# Patient Record
Sex: Male | Born: 1950 | Race: White | Hispanic: No | Marital: Married | Smoking: Former smoker
Health system: Southern US, Community
[De-identification: ages and names within clinical notes are randomized; demographics above are authoritative.]

## PROBLEM LIST (undated history)

## (undated) DIAGNOSIS — M1990 Unspecified osteoarthritis, unspecified site: Secondary | ICD-10-CM

## (undated) DIAGNOSIS — C801 Malignant (primary) neoplasm, unspecified: Secondary | ICD-10-CM

## (undated) DIAGNOSIS — K7031 Alcoholic cirrhosis of liver with ascites: Secondary | ICD-10-CM

## (undated) DIAGNOSIS — I10 Essential (primary) hypertension: Secondary | ICD-10-CM

## (undated) DIAGNOSIS — R29898 Other symptoms and signs involving the musculoskeletal system: Secondary | ICD-10-CM

## (undated) DIAGNOSIS — K759 Inflammatory liver disease, unspecified: Secondary | ICD-10-CM

## (undated) HISTORY — PX: NO PAST SURGERIES: SHX2092

---

## 2013-12-08 ENCOUNTER — Emergency Department: Payer: Self-pay | Admitting: Emergency Medicine

## 2016-11-27 ENCOUNTER — Encounter: Payer: Self-pay | Admitting: Emergency Medicine

## 2016-11-27 ENCOUNTER — Emergency Department: Payer: 59

## 2016-11-27 ENCOUNTER — Inpatient Hospital Stay
Admission: EM | Admit: 2016-11-27 | Discharge: 2016-11-30 | DRG: 434 | Disposition: A | Discharge status: Home / Self Care | Payer: 59 | Attending: Internal Medicine | Admitting: Internal Medicine

## 2016-11-27 ENCOUNTER — Inpatient Hospital Stay
Admit: 2016-11-27 | Discharge: 2016-11-27 | Disposition: A | Discharge status: Home / Self Care | Payer: 59 | Attending: Internal Medicine | Admitting: Internal Medicine

## 2016-11-27 DIAGNOSIS — R601 Generalized edema: Secondary | ICD-10-CM

## 2016-11-27 DIAGNOSIS — R946 Abnormal results of thyroid function studies: Secondary | ICD-10-CM | POA: Diagnosis present

## 2016-11-27 DIAGNOSIS — Z87891 Personal history of nicotine dependence: Secondary | ICD-10-CM

## 2016-11-27 DIAGNOSIS — R945 Abnormal results of liver function studies: Secondary | ICD-10-CM | POA: Diagnosis present

## 2016-11-27 DIAGNOSIS — R188 Other ascites: Secondary | ICD-10-CM

## 2016-11-27 DIAGNOSIS — I10 Essential (primary) hypertension: Secondary | ICD-10-CM | POA: Diagnosis present

## 2016-11-27 DIAGNOSIS — D509 Iron deficiency anemia, unspecified: Secondary | ICD-10-CM | POA: Diagnosis present

## 2016-11-27 DIAGNOSIS — F1021 Alcohol dependence, in remission: Secondary | ICD-10-CM | POA: Diagnosis present

## 2016-11-27 DIAGNOSIS — R609 Edema, unspecified: Secondary | ICD-10-CM | POA: Diagnosis present

## 2016-11-27 DIAGNOSIS — R5383 Other fatigue: Secondary | ICD-10-CM | POA: Diagnosis present

## 2016-11-27 DIAGNOSIS — Z79899 Other long term (current) drug therapy: Secondary | ICD-10-CM | POA: Diagnosis not present

## 2016-11-27 DIAGNOSIS — B1920 Unspecified viral hepatitis C without hepatic coma: Secondary | ICD-10-CM | POA: Diagnosis present

## 2016-11-27 DIAGNOSIS — R140 Abdominal distension (gaseous): Secondary | ICD-10-CM | POA: Diagnosis present

## 2016-11-27 DIAGNOSIS — D72819 Decreased white blood cell count, unspecified: Secondary | ICD-10-CM | POA: Diagnosis present

## 2016-11-27 DIAGNOSIS — E8770 Fluid overload, unspecified: Secondary | ICD-10-CM

## 2016-11-27 DIAGNOSIS — R770 Abnormality of albumin: Secondary | ICD-10-CM | POA: Diagnosis not present

## 2016-11-27 DIAGNOSIS — D539 Nutritional anemia, unspecified: Secondary | ICD-10-CM | POA: Diagnosis present

## 2016-11-27 DIAGNOSIS — R531 Weakness: Secondary | ICD-10-CM | POA: Diagnosis present

## 2016-11-27 DIAGNOSIS — R7989 Other specified abnormal findings of blood chemistry: Secondary | ICD-10-CM | POA: Diagnosis present

## 2016-11-27 DIAGNOSIS — R0602 Shortness of breath: Secondary | ICD-10-CM | POA: Diagnosis present

## 2016-11-27 DIAGNOSIS — E876 Hypokalemia: Secondary | ICD-10-CM | POA: Diagnosis present

## 2016-11-27 DIAGNOSIS — D649 Anemia, unspecified: Secondary | ICD-10-CM

## 2016-11-27 DIAGNOSIS — Z791 Long term (current) use of non-steroidal anti-inflammatories (NSAID): Secondary | ICD-10-CM

## 2016-11-27 DIAGNOSIS — K7031 Alcoholic cirrhosis of liver with ascites: Secondary | ICD-10-CM | POA: Diagnosis present

## 2016-11-27 DIAGNOSIS — E8809 Other disorders of plasma-protein metabolism, not elsewhere classified: Secondary | ICD-10-CM | POA: Diagnosis present

## 2016-11-27 HISTORY — DX: Essential (primary) hypertension: I10

## 2016-11-27 HISTORY — DX: Other symptoms and signs involving the musculoskeletal system: R29.898

## 2016-11-27 LAB — COMPREHENSIVE METABOLIC PANEL
ALK PHOS: 120 U/L (ref 38–126)
ALT: 42 U/L (ref 17–63)
AST: 100 U/L — AB (ref 15–41)
Albumin: 2.7 g/dL — ABNORMAL LOW (ref 3.5–5.0)
Anion gap: 6 (ref 5–15)
BUN: 19 mg/dL (ref 6–20)
CALCIUM: 8.2 mg/dL — AB (ref 8.9–10.3)
CHLORIDE: 100 mmol/L — AB (ref 101–111)
CO2: 31 mmol/L (ref 22–32)
CREATININE: 0.87 mg/dL (ref 0.61–1.24)
GFR calc non Af Amer: >60 mL/min (ref >60)
GLUCOSE: 111 mg/dL — AB (ref 65–99)
Potassium: 2.8 mmol/L — ABNORMAL LOW (ref 3.5–5.1)
SODIUM: 137 mmol/L (ref 135–145)
Total Bilirubin: 1.2 mg/dL (ref 0.3–1.2)
Total Protein: 6.9 g/dL (ref 6.5–8.1)

## 2016-11-27 LAB — APTT: aPTT: 33 seconds (ref 24–36)

## 2016-11-27 LAB — TROPONIN I: Troponin I: <0.03 ng/mL (ref <0.03)

## 2016-11-27 LAB — URINALYSIS, COMPLETE (UACMP) WITH MICROSCOPIC
BACTERIA UA: NONE SEEN
Bilirubin Urine: NEGATIVE
Glucose, UA: NEGATIVE mg/dL
Hgb urine dipstick: NEGATIVE
KETONES UR: NEGATIVE mg/dL
Leukocytes, UA: NEGATIVE
Nitrite: NEGATIVE
PH: 6 (ref 5.0–8.0)
PROTEIN: NEGATIVE mg/dL
Specific Gravity, Urine: 1.005 (ref 1.005–1.030)
Squamous Epithelial / LPF: NONE SEEN

## 2016-11-27 LAB — TYPE AND SCREEN
ABO/RH(D): A POS
Antibody Screen: NEGATIVE

## 2016-11-27 LAB — CBC WITH DIFFERENTIAL/PLATELET
BASOS ABS: 0 10*3/uL (ref 0–0.1)
BASOS PCT: 1 %
EOS ABS: 0.1 10*3/uL (ref 0–0.7)
EOS PCT: 3 %
HCT: 25.2 % — ABNORMAL LOW (ref 40.0–52.0)
HEMOGLOBIN: 7.9 g/dL — AB (ref 13.0–18.0)
Lymphocytes Relative: 23 %
Lymphs Abs: 0.9 10*3/uL — ABNORMAL LOW (ref 1.0–3.6)
MCH: 22 pg — AB (ref 26.0–34.0)
MCHC: 31.4 g/dL — ABNORMAL LOW (ref 32.0–36.0)
MCV: 70.2 fL — ABNORMAL LOW (ref 80.0–100.0)
Monocytes Absolute: 0.4 10*3/uL (ref 0.2–1.0)
Monocytes Relative: 11 %
NEUTROS PCT: 62 %
Neutro Abs: 2.4 10*3/uL (ref 1.4–6.5)
PLATELETS: 226 10*3/uL (ref 150–440)
RBC: 3.59 MIL/uL — AB (ref 4.40–5.90)
RDW: 18.9 % — ABNORMAL HIGH (ref 11.5–14.5)
WBC: 3.9 10*3/uL (ref 3.8–10.6)

## 2016-11-27 LAB — IRON AND TIBC
IRON: 12 ug/dL — AB (ref 45–182)
SATURATION RATIOS: 3 % — AB (ref 17.9–39.5)
TIBC: 393 ug/dL (ref 250–450)
UIBC: 381 ug/dL

## 2016-11-27 LAB — BRAIN NATRIURETIC PEPTIDE: B NATRIURETIC PEPTIDE 5: 104 pg/mL — AB (ref 0.0–100.0)

## 2016-11-27 LAB — PROTIME-INR
INR: 1.3
PROTHROMBIN TIME: 16.3 s — AB (ref 11.4–15.2)

## 2016-11-27 MED ORDER — TAMSULOSIN HCL 0.4 MG PO CAPS
0.4000 mg | ORAL_CAPSULE | Freq: Every day | ORAL | Status: DC
  Administered 2016-11-27 – 2016-11-30 (×4): 0.4 mg via ORAL
  Filled 2016-11-27 (×4): qty 1

## 2016-11-27 MED ORDER — OXYCODONE-ACETAMINOPHEN 10-325 MG PO TABS: 1.0000 | ORAL_TABLET | Freq: Four times a day (QID) | ORAL | Status: DC | PRN

## 2016-11-27 MED ORDER — HEPARIN SODIUM (PORCINE) 5000 UNIT/ML IJ SOLN
5000.0000 [IU] | Freq: Three times a day (TID) | INTRAMUSCULAR | Status: DC
  Administered 2016-11-27 – 2016-11-30 (×8): 5000 [IU] via SUBCUTANEOUS
  Filled 2016-11-27 (×8): qty 1

## 2016-11-27 MED ORDER — MELOXICAM 7.5 MG PO TABS
15.0000 mg | ORAL_TABLET | Freq: Every day | ORAL | Status: DC
  Administered 2016-11-27 – 2016-11-30 (×4): 15 mg via ORAL
  Filled 2016-11-27 (×4): qty 2

## 2016-11-27 MED ORDER — POTASSIUM CHLORIDE 10 MEQ/100ML IV SOLN
10.0000 meq | Freq: Once | INTRAVENOUS | Status: AC
  Administered 2016-11-27: 10 meq via INTRAVENOUS
  Filled 2016-11-27: qty 100

## 2016-11-27 MED ORDER — OXYCODONE HCL 5 MG PO TABS
5.0000 mg | ORAL_TABLET | Freq: Four times a day (QID) | ORAL | Status: DC | PRN
  Administered 2016-11-28 – 2016-11-30 (×4): 5 mg via ORAL
  Filled 2016-11-27 (×4): qty 1

## 2016-11-27 MED ORDER — PANTOPRAZOLE SODIUM 40 MG PO TBEC
40.0000 mg | DELAYED_RELEASE_TABLET | Freq: Two times a day (BID) | ORAL | Status: DC
  Administered 2016-11-27 – 2016-11-30 (×4): 40 mg via ORAL
  Filled 2016-11-27 (×4): qty 1

## 2016-11-27 MED ORDER — OXYCODONE-ACETAMINOPHEN 5-325 MG PO TABS
1.0000 | ORAL_TABLET | Freq: Four times a day (QID) | ORAL | Status: DC | PRN
  Administered 2016-11-28 – 2016-11-30 (×7): 1 via ORAL
  Filled 2016-11-27 (×7): qty 1

## 2016-11-27 MED ORDER — POTASSIUM CHLORIDE CRYS ER 20 MEQ PO TBCR
40.0000 meq | EXTENDED_RELEASE_TABLET | Freq: Once | ORAL | Status: AC
  Administered 2016-11-27: 40 meq via ORAL
  Filled 2016-11-27: qty 2

## 2016-11-27 MED ORDER — DOCUSATE SODIUM 100 MG PO CAPS
100.0000 mg | ORAL_CAPSULE | Freq: Two times a day (BID) | ORAL | Status: DC | PRN
  Filled 2016-11-27: qty 1

## 2016-11-27 MED ORDER — ALBUTEROL SULFATE (2.5 MG/3ML) 0.083% IN NEBU: 5.0000 mg | INHALATION_SOLUTION | Freq: Once | RESPIRATORY_TRACT | Status: DC

## 2016-11-27 MED ORDER — FUROSEMIDE 10 MG/ML IJ SOLN
20.0000 mg | Freq: Three times a day (TID) | INTRAMUSCULAR | Status: DC
Start: 2016-11-27 — End: 2016-11-29
  Administered 2016-11-27 – 2016-11-29 (×6): 20 mg via INTRAVENOUS
  Filled 2016-11-27 (×4): qty 2
  Filled 2016-11-27: qty 4
  Filled 2016-11-27 (×2): qty 2

## 2016-11-27 NOTE — ED Provider Notes (Signed)
[facility] Emergency Department Provider Note  ____________________________________________  Time seen: Approximately 3:00 PM  I have reviewed the triage vital signs and the nursing notes.   HISTORY  Chief Complaint Shortness of Breath   HPI Shane Bradley is a 66 y.o. male with a history of hypertension who presents for evaluation of abdominal distention, shortness of breath, lower extremity pitting edema. Patient reports 15 pound weight gain in the last month with 5 in the last week. Has had significantly increased abdominal girth, edema of his bilateral lower extremities. He has shortness of breath with minimal exertion which is constant and getting progressively worse. No chest pain, no URI symptoms, no fever, no nausea or vomiting, no dysuria or hematuria. Patient was seen at urgent care and sent here for further evaluation. Patient denies history of alcoholism and hasn't had an alcoholic drink in over a decade, is a former smoker, no drugs. He denies any personal or family history of ischemic heart disease, heart failure, or liver disease. He denies any active bleeding such as melena, hematemesis, coffee-ground emesis, hemoptysis, or hematuria. Patient denies any known prior history of anemia. He does a family history of ischemic heart disease in his father.  Past Medical History:  Diagnosis Date  . Back complaints   . Hypertension     Patient Active Problem List   Diagnosis Date Noted  . Anasarca 11/27/2016  . Symptomatic anemia 11/27/2016    History reviewed. No pertinent surgical history.  Prior to Admission medications   Medication Sig Start Date End Date Taking? Authorizing Provider  hydrochlorothiazide (HYDRODIURIL) 25 MG tablet Take 25 mg by mouth daily. 10/11/16  Yes [provider]  meloxicam (MOBIC) 15 MG tablet Take 15 mg by mouth daily. 11/15/16  Yes [provider]  oxyCODONE-acetaminophen (PERCOCET) 10-325 MG tablet  Take 1 tablet by mouth every 6 (six) hours as needed. 11/15/16  Yes [provider]  tamsulosin (FLOMAX) 0.4 MG CAPS capsule Take 0.4 mg by mouth daily. 11/23/16  Yes [provider]    Allergies Patient has no known allergies.  Family History  Problem Relation Age of Onset  . Kidney cancer Mother   . CAD Father     Social History Social History  Substance Use Topics  . Smoking status: Former Research scientist (life sciences)  . Smokeless tobacco: Never Used  . Alcohol use No    Review of Systems  Constitutional: Negative for fever. Eyes: Negative for visual changes. ENT: Negative for sore throat. Neck: No neck pain  Cardiovascular: Negative for chest pain. Respiratory: +shortness of breath. Gastrointestinal: Negative for abdominal pain, vomiting or diarrhea. + abdominal distention and weight gain Genitourinary: Negative for dysuria.  Musculoskeletal: Negative for back pain. + b/l LE edema Skin: Negative for rash. Neurological: Negative for headaches, weakness or numbness. Psych: No SI or HI  ____________________________________________   PHYSICAL EXAM:  VITAL SIGNS: ED Triage Vitals [11/27/16 1338]  Enc Vitals Group     BP (!) 167/79     Pulse Rate 78     Resp 16     Temp 98.1 F (36.7 C)     Temp Source Oral     SpO2 99 %     Weight 225 lb (102.1 kg)     Height 5\' 7"  (1.702 m)     Head Circumference      Peak Flow      Pain Score      Pain Loc      Pain Edu?  Excl. in [place]?     Constitutional: Alert and oriented. Well appearing and in no apparent distress. HEENT:      Head: Normocephalic and atraumatic.         Eyes: Conjunctivae are normal. Sclera is non-icteric.       Mouth/Throat: Mucous membranes are moist.       Neck: Supple with no signs of meningismus. Cardiovascular: Regular rate and rhythm. No murmurs, gallops, or rubs. 2+ symmetrical distal pulses are present in all extremities. No JVD. Respiratory: Normal respiratory effort. Lungs are clear to  auscultation bilaterally. No wheezes, crackles, or rhonchi.  Gastrointestinal: Severely distended abdomen, non tender, positive bowel sounds. No rebound or guarding. rectal exam showing brown stool guaiac negative  Musculoskeletal: 2+ pitting edema of b/l LE Neurologic: Normal speech and language. Face is symmetric. Moving all extremities. No gross focal neurologic deficits are appreciated. Skin: Skin is warm, dry and intact. No rash noted. Psychiatric: Mood and affect are normal. Speech and behavior are normal.  ____________________________________________   LABS (all labs ordered are listed, but only abnormal results are displayed)  Labs Reviewed  CBC WITH DIFFERENTIAL/PLATELET - Abnormal; Notable for the following:       Result Value   RBC 3.59 (*)    Hemoglobin 7.9 (*)    HCT 25.2 (*)    MCV 70.2 (*)    MCH 22.0 (*)    MCHC 31.4 (*)    RDW 18.9 (*)    Lymphs Abs 0.9 (*)    All other components within normal limits  COMPREHENSIVE METABOLIC PANEL - Abnormal; Notable for the following:    Potassium 2.8 (*)    Chloride 100 (*)    Glucose, Bld 111 (*)    Calcium 8.2 (*)    Albumin 2.7 (*)    AST 100 (*)    All other components within normal limits  BRAIN NATRIURETIC PEPTIDE - Abnormal; Notable for the following:    B Natriuretic Peptide 104.0 (*)    All other components within normal limits  PROTIME-INR - Abnormal; Notable for the following:    Prothrombin Time 16.3 (*)    All other components within normal limits  URINALYSIS, COMPLETE (UACMP) WITH MICROSCOPIC - Abnormal; Notable for the following:    Color, Urine STRAW (*)    APPearance CLEAR (*)    All other components within normal limits  IRON AND TIBC - Abnormal; Notable for the following:    Iron 12 (*)    Saturation Ratios 3 (*)    All other components within normal limits  TROPONIN I  APTT  TROPONIN I  TROPONIN I  TROPONIN I  BASIC METABOLIC PANEL  CBC  TYPE AND SCREEN    ____________________________________________  EKG  ED ECG REPORT I, Rudene Re, the attending physician, personally viewed and interpreted this ECG.  Normal sinus rhythm, rate of 78, normal intervals, normal axis, no ST elevations or depressions. Normal EKG. ____________________________________________  RADIOLOGY  CXR: No acute cardiopulmonary abnormality. ____________________________________________   PROCEDURES  Procedure(s) performed: None Procedures Critical Care performed:  None ____________________________________________   INITIAL IMPRESSION / ASSESSMENT AND PLAN / ED COURSE  66 y.o. male with a history of hypertension who presents for evaluation of abdominal distention, shortness of breath, lower extremity pitting edema, and 15-lb weight gain x 1 month. Patient has severely distended abdomen with a bedside ultrasound showing moderate to large amount of ascites, also has 3+ pitting edema. Lungs are clear to auscultation and chest x-ray shows no evidence  of pulmonary edema. Patient has normal sats and normal work of breathing. Remainder of his vital signs are within normal limits. Blood work showing anemia with a hemoglobin of 7.9 and MCV of 70. Patient denies any active bleeding. She denies any prior history of known anemia. Patient is also hypokalemic with a K of 2.8 and has mildly elevated AST of 100. Presentation concerning for CHF versus liver disease causing volume overload.     Pertinent labs & imaging results that were available during my care of the patient were reviewed by me and considered in my medical decision making (see chart for details).    ____________________________________________   FINAL CLINICAL IMPRESSION(S) / ED DIAGNOSES  Final diagnoses:  SOB (shortness of breath)  Other ascites  Hypervolemia, unspecified hypervolemia type  Anemia, unspecified type  Hypokalemia      NEW MEDICATIONS STARTED DURING THIS VISIT:  Current  Discharge Medication List       Note:  This document was prepared using Dragon voice recognition software and may include unintentional dictation errors.    Rudene Re, MD 11/27/16 2034

## 2016-11-27 NOTE — ED Triage Notes (Signed)
C/O Shortness of breath with exertion x 1 week.  Also c/o intermittent chest pain.  Denies current complaint of CP.  Skin is warm and dry.  Speaks in strong voice with full sentences.  No SOB/ DOE noted.

## 2016-11-27 NOTE — H&P (Signed)
[facility] [place] NAME: Shane Bradley    MR#:  [id number]  DATE OF BIRTH:  August 23, 1950  DATE OF ADMISSION:  11/27/2016  PRIMARY CARE PHYSICIAN: Marden Noble, MD   REQUESTING/REFERRING PHYSICIAN: Alfred Levins  CHIEF COMPLAINT:   Chief Complaint  Patient presents with  . Shortness of Breath    HISTORY OF PRESENT ILLNESS: Shane Bradley  is a 66 y.o. male with a known history of Hypertension- takes his medication regularly and go for checkup every year. For last 1 week started noticing swelling on his legs which is gradually coming up and now up under his abdominal wall. He is also increasingly getting short of breath with minimal activities like walking from his car to the parking lot. He denies any chest pain, palpitation, bleeding in stool or vomiting. He went to walk-in clinic and after examining him he was advised to go to emergency room for further management. In ER he is noted to have significant edema and his hemoglobin is 7.9 but as per ER physician his stool guaiac was negative.  PAST MEDICAL HISTORY:   Past Medical History:  Diagnosis Date  . Back complaints   . Hypertension     PAST SURGICAL HISTORY: History reviewed. No pertinent surgical history.  SOCIAL HISTORY:  Social History  Substance Use Topics  . Smoking status: Former Research scientist (life sciences)  . Smokeless tobacco: Never Used  . Alcohol use No    FAMILY HISTORY:  Family History  Problem Relation Age of Onset  . Kidney cancer Mother   . CAD Father     DRUG ALLERGIES: No Known Allergies  REVIEW OF SYSTEMS:   CONSTITUTIONAL: No fever,Positive for fatigue or weakness.  EYES: No blurred or double vision.  EARS, NOSE, AND THROAT: No tinnitus or ear pain.  RESPIRATORY: No cough, positive for shortness of breath, wheezing , no hemoptysis.  CARDIOVASCULAR: No chest pain, orthopnea, edema.  GASTROINTESTINAL: No nausea, vomiting, diarrhea or abdominal pain.  GENITOURINARY: No dysuria,  hematuria.  ENDOCRINE: No polyuria, nocturia,  HEMATOLOGY: No anemia, easy bruising or bleeding SKIN: No rash or lesion. MUSCULOSKELETAL: No joint pain or arthritis.   NEUROLOGIC: No tingling, numbness, weakness.  PSYCHIATRY: No anxiety or depression.   MEDICATIONS AT HOME:  Prior to Admission medications   Medication Sig Start Date End Date Taking? Authorizing Provider  hydrochlorothiazide (HYDRODIURIL) 25 MG tablet Take 25 mg by mouth daily. 10/11/16  Yes [provider]  meloxicam (MOBIC) 15 MG tablet Take 15 mg by mouth daily. 11/15/16  Yes [provider]  oxyCODONE-acetaminophen (PERCOCET) 10-325 MG tablet Take 1 tablet by mouth every 6 (six) hours as needed. 11/15/16  Yes [provider]  tamsulosin (FLOMAX) 0.4 MG CAPS capsule Take 0.4 mg by mouth daily. 11/23/16  Yes [provider]      PHYSICAL EXAMINATION:   VITAL SIGNS: Blood pressure (!) 167/79, pulse 78, temperature 98.1 F (36.7 C), temperature source Oral, resp. rate 16, height 5\' 7"  (1.702 m), weight 102.1 kg (225 lb), SpO2 99 %.  GENERAL:  66 y.o.-year-old patient lying in the bed with no acute distress.  EYES: Pupils equal, round, reactive to light and accommodation. No scleral icterus. Extraocular muscles intact.  HEENT: Head atraumatic, normocephalic. Oropharynx and nasopharynx clear.  NECK:  Supple, no jugular venous distention. No thyroid enlargement, no tenderness.  LUNGS: Normal breath sounds bilaterally, no wheezing, No crepitation. No use of accessory muscles of respiration.  CARDIOVASCULAR: S1, S2 normal. No murmurs,  rubs, or gallops.  ABDOMEN: Soft, nontender, nondistended. Bowel sounds present. No organomegaly or mass.  EXTREMITIES: Bilateral significant pedal edema, no cyanosis, or clubbing. Edema extending to his lower abdominal wall. NEUROLOGIC: Cranial nerves II through XII are intact. Muscle strength 5/5 in all extremities. Sensation intact. Gait not checked.   PSYCHIATRIC: The patient is alert and oriented x 3.  SKIN: No obvious rash, lesion, or ulcer.   LABORATORY PANEL:   CBC  Recent Labs Lab 11/27/16 1339  WBC 3.9  HGB 7.9*  HCT 25.2*  PLT 226  MCV 70.2*  MCH 22.0*  MCHC 31.4*  RDW 18.9*  LYMPHSABS 0.9*  MONOABS 0.4  EOSABS 0.1  BASOSABS 0.0   ------------------------------------------------------------------------------------------------------------------  Chemistries   Recent Labs Lab 11/27/16 1339  NA 137  K 2.8*  CL 100*  CO2 31  GLUCOSE 111*  BUN 19  CREATININE 0.87  CALCIUM 8.2*  AST 100*  ALT 42  ALKPHOS 120  BILITOT 1.2   ------------------------------------------------------------------------------------------------------------------ estimated creatinine clearance is 96.4 mL/min (by C-G formula based on SCr of 0.87 mg/dL). ------------------------------------------------------------------------------------------------------------------ No results for input(s): TSH, T4TOTAL, T3FREE, THYROIDAB in the last 72 hours.  Invalid input(s): FREET3   Coagulation profile  Recent Labs Lab 11/27/16 1451  INR 1.30   ------------------------------------------------------------------------------------------------------------------- No results for input(s): DDIMER in the last 72 hours. -------------------------------------------------------------------------------------------------------------------  Cardiac Enzymes  Recent Labs Lab 11/27/16 1339  TROPONINI <0.03   ------------------------------------------------------------------------------------------------------------------ Invalid input(s): POCBNP  ---------------------------------------------------------------------------------------------------------------  Urinalysis No results found for: COLORURINE, APPEARANCEUR, LABSPEC, PHURINE, GLUCOSEU, HGBUR, BILIRUBINUR, KETONESUR, PROTEINUR, UROBILINOGEN, NITRITE, LEUKOCYTESUR   RADIOLOGY: Dg Chest  2 View  Result Date: 11/27/2016 CLINICAL DATA:  66 year old male with shortness of breath with exertion for 1 week and intermittent chest pain. EXAM: CHEST  2 VIEW COMPARISON:  None. FINDINGS: Chronic appearing multilevel left lateral rib fractures. Low normal lung volumes. Mediastinal contours are normal. Visualized tracheal air column is within normal limits. No pneumothorax, pulmonary edema, pleural effusion or confluent pulmonary opacity. No acute osseous abnormality identified. Negative visible bowel gas pattern. IMPRESSION: No acute cardiopulmonary abnormality. Electronically Signed   By: Genevie Ann M.D.   On: 11/27/2016 14:07    EKG: Orders placed or performed during the hospital encounter of 11/27/16  . ED EKG  . ED EKG  . EKG 12-Lead  . EKG 12-Lead    IMPRESSION AND PLAN:  * Anasarca   Likely secondary to congestive heart failure   We will give IV Lasix, fluid restriction, intake and output measurement, daily weight.   Check echocardiogram and get cardiology consult.   Keep on telemetry monitoring and follow serial troponin.  * Macrocytic anemia   As per ER, stool guaiac is negative.   We'll check iron studies. Since stool to level artery for guaiac test.   GI consult for further management.  * Low albumin and slightly elevated liver enzymes.    We'll call GI consult.  All the records are reviewed and case discussed with ED provider. Management plans discussed with the patient, family and they are in agreement.  CODE STATUS: Full code.  Code Status History    This patient does not have a recorded code status. Please follow your organizational policy for patients in this situation.       TOTAL TIME TAKING CARE OF THIS PATIENT: 50 minutes.    Vaughan Basta M.D on 11/27/2016   Between 7am to 6pm - Pager - [phone]  After 6pm go to [email or web address] - [place]  [facility]  Office  [phone]  CC: Primary care physician; Marden Noble, MD   Note: This dictation was prepared with Dragon dictation along with smaller phrase technology. Any transcriptional errors that result from this process are unintentional.

## 2016-11-27 NOTE — ED Notes (Signed)
Pt presents with cp, sob, swelling x 1 week. Pt reports sob mostly upon exertion, as well as swelling in legs and abdomen. Pt sent over from walk in clinic. 5# weight gain in last week.

## 2016-11-28 ENCOUNTER — Inpatient Hospital Stay: Payer: 59

## 2016-11-28 DIAGNOSIS — R945 Abnormal results of liver function studies: Secondary | ICD-10-CM

## 2016-11-28 DIAGNOSIS — D509 Iron deficiency anemia, unspecified: Secondary | ICD-10-CM

## 2016-11-28 DIAGNOSIS — R770 Abnormality of albumin: Secondary | ICD-10-CM

## 2016-11-28 DIAGNOSIS — K7031 Alcoholic cirrhosis of liver with ascites: Principal | ICD-10-CM

## 2016-11-28 LAB — CBC
HCT: 23.5 % — ABNORMAL LOW (ref 40.0–52.0)
HEMOGLOBIN: 7.3 g/dL — AB (ref 13.0–18.0)
MCH: 21.9 pg — ABNORMAL LOW (ref 26.0–34.0)
MCHC: 31.2 g/dL — AB (ref 32.0–36.0)
MCV: 70.4 fL — ABNORMAL LOW (ref 80.0–100.0)
Platelets: 181 10*3/uL (ref 150–440)
RBC: 3.33 MIL/uL — AB (ref 4.40–5.90)
RDW: 19.1 % — ABNORMAL HIGH (ref 11.5–14.5)
WBC: 3.2 10*3/uL — AB (ref 3.8–10.6)

## 2016-11-28 LAB — ALBUMIN, PLEURAL OR PERITONEAL FLUID

## 2016-11-28 LAB — BASIC METABOLIC PANEL
Anion gap: 6 (ref 5–15)
BUN: 18 mg/dL (ref 6–20)
CO2: 31 mmol/L (ref 22–32)
Calcium: 8.1 mg/dL — ABNORMAL LOW (ref 8.9–10.3)
Chloride: 101 mmol/L (ref 101–111)
Creatinine, Ser: 0.98 mg/dL (ref 0.61–1.24)
GFR calc Af Amer: >60 mL/min (ref >60)
GFR calc non Af Amer: >60 mL/min (ref >60)
Glucose, Bld: 92 mg/dL (ref 65–99)
Potassium: 2.9 mmol/L — ABNORMAL LOW (ref 3.5–5.1)
Sodium: 138 mmol/L (ref 135–145)

## 2016-11-28 LAB — PROTIME-INR
INR: 1.27
Prothrombin Time: 16 seconds — ABNORMAL HIGH (ref 11.4–15.2)

## 2016-11-28 LAB — PROTEIN, PLEURAL OR PERITONEAL FLUID: Total protein, fluid: <3 g/dL

## 2016-11-28 LAB — BODY FLUID CELL COUNT WITH DIFFERENTIAL
Eos, Fluid: 1 %
LYMPHS FL: 74 %
MONOCYTE-MACROPHAGE-SEROUS FLUID: 18 %
NEUTROPHIL FLUID: 6 %
Other Cells, Fluid: 1 %
Total Nucleated Cell Count, Fluid: 249 cu mm

## 2016-11-28 LAB — HEPATIC FUNCTION PANEL
ALT: 41 U/L (ref 17–63)
AST: 97 U/L — ABNORMAL HIGH (ref 15–41)
Albumin: 2.5 g/dL — ABNORMAL LOW (ref 3.5–5.0)
Alkaline Phosphatase: 129 U/L — ABNORMAL HIGH (ref 38–126)
Bilirubin, Direct: 0.4 mg/dL (ref 0.1–0.5)
Indirect Bilirubin: 0.7 mg/dL (ref 0.3–0.9)
Total Bilirubin: 1.1 mg/dL (ref 0.3–1.2)
Total Protein: 6.6 g/dL (ref 6.5–8.1)

## 2016-11-28 LAB — ECHOCARDIOGRAM COMPLETE
Height: 67 in
Weight: 3600 oz

## 2016-11-28 LAB — TSH: TSH: 5.989 u[IU]/mL — ABNORMAL HIGH (ref 0.350–4.500)

## 2016-11-28 LAB — GLUCOSE, PLEURAL OR PERITONEAL FLUID: Glucose, Fluid: 105 mg/dL

## 2016-11-28 LAB — TROPONIN I

## 2016-11-28 LAB — CK: Total CK: 297 U/L (ref 49–397)

## 2016-11-28 LAB — MAGNESIUM: Magnesium: 1.4 mg/dL — ABNORMAL LOW (ref 1.7–2.4)

## 2016-11-28 LAB — GAMMA GT: GGT: 164 U/L — ABNORMAL HIGH (ref 7–50)

## 2016-11-28 LAB — AMYLASE, PLEURAL OR PERITONEAL FLUID: Amylase, Fluid: 38 U/L

## 2016-11-28 LAB — LACTATE DEHYDROGENASE, PLEURAL OR PERITONEAL FLUID: LD, Fluid: 72 U/L — ABNORMAL HIGH (ref 3–23)

## 2016-11-28 LAB — C-REACTIVE PROTEIN: CRP: 3.9 mg/dL — ABNORMAL HIGH (ref <1.0)

## 2016-11-28 LAB — SEDIMENTATION RATE: Sed Rate: 33 mm/hr — ABNORMAL HIGH (ref 0–20)

## 2016-11-28 MED ORDER — IRON SUCROSE 20 MG/ML IV SOLN
150.0000 mg | Freq: Once | INTRAVENOUS | Status: AC
  Administered 2016-11-28: 150 mg via INTRAVENOUS
  Filled 2016-11-28: qty 7.5

## 2016-11-28 MED ORDER — ALBUMIN HUMAN 25 % IV SOLN
50.0000 g | Freq: Once | INTRAVENOUS | Status: AC
  Administered 2016-11-28: 50 g via INTRAVENOUS
  Filled 2016-11-28: qty 200

## 2016-11-28 MED ORDER — POTASSIUM CHLORIDE CRYS ER 20 MEQ PO TBCR
40.0000 meq | EXTENDED_RELEASE_TABLET | Freq: Once | ORAL | Status: AC
  Administered 2016-11-28: 40 meq via ORAL
  Filled 2016-11-28: qty 2

## 2016-11-28 MED ORDER — POTASSIUM CHLORIDE 20 MEQ PO PACK: 40.0000 meq | PACK | Freq: Once | ORAL | Status: DC

## 2016-11-28 MED ORDER — POTASSIUM CHLORIDE 20 MEQ PO PACK
40.0000 meq | PACK | Freq: Once | ORAL | Status: DC
  Filled 2016-11-28: qty 2

## 2016-11-28 NOTE — Consult Note (Signed)
Shane Bellows MD, MRCP(U.K) [address]  [place]  [place], [place] [address]  Main: [phone]  Fax: [phone]  Consultation  Referring Provider:     Dr Vianne Bulls Primary Care Physician:  Marden Noble, MD Primary Gastroenterologist:  None          Reason for Consultation:     Anemia, low albumin , abnormal LFT's   Date of Admission:  11/27/2016 Date of Consultation:  11/28/2016         HPI:   Shane Bradley is a 66 y.o. male admitted with swelling of legs and shortness of breath on minimal exertion . In the ER noted to have a Hb of 7.9 , stool guiac negative, low albumin and mildly elevated transaminases and hence GI consult called. No prior labs to compare with   He says he was doing well till a week back when his abdomen and legs started to swell up . Denies any nose bleeds, blood in urine, stool . Denies any prior colonoscopy , no family history of colon cancer or polyps. Denies any weight loss. Normal apetite. Last labs he recollects one year back was normal. He says he was a drinker all his life and quit a year or two back. Denies any illegal drug use.    Past Medical History:  Diagnosis Date  . Back complaints   . Hypertension     History reviewed. No pertinent surgical history.  Prior to Admission medications   Medication Sig Start Date End Date Taking? Authorizing Provider  hydrochlorothiazide (HYDRODIURIL) 25 MG tablet Take 25 mg by mouth daily. 10/11/16  Yes [provider]  meloxicam (MOBIC) 15 MG tablet Take 15 mg by mouth daily. 11/15/16  Yes [provider]  oxyCODONE-acetaminophen (PERCOCET) 10-325 MG tablet Take 1 tablet by mouth every 6 (six) hours as needed. 11/15/16  Yes [provider]  tamsulosin (FLOMAX) 0.4 MG CAPS capsule Take 0.4 mg by mouth daily. 11/23/16  Yes [provider]    Family History  Problem Relation Age of Onset  . Kidney cancer Mother   . CAD Father      Social History  Substance Use  Topics  . Smoking status: Former Research scientist (life sciences)  . Smokeless tobacco: Never Used  . Alcohol use No    Allergies as of 11/27/2016  . (No Known Allergies)    Review of Systems:    All systems reviewed and negative except where noted in HPI.   Physical Exam:  Vital signs in last 24 hours: Temp:  [98.1 F (36.7 C)-98.9 F (37.2 C)] 98.9 F (37.2 C) (06/13 0924) Pulse Rate:  [78-110] 86 (06/13 0924) Resp:  [14-26] 18 (06/13 0924) BP: (131-172)/(68-143) 147/76 (06/13 0924) SpO2:  [96 %-100 %] 96 % (06/13 0924) Weight:  [225 lb (102.1 kg)] 225 lb (102.1 kg) (06/12 1338) Last BM Date: 11/27/16 General:   Pleasant, cooperative in NAD Head:  Normocephalic and atraumatic. Eyes:   No icterus.   Conjunctiva pink. PERRLA. Ears:  Normal auditory acuity. Neck:  Supple; no masses or thyroidomegaly Lungs: Respirations even and unlabored. Lungs clear to auscultation bilaterally.   No wheezes, crackles, or rhonchi.  Heart:  Regular rate and rhythm;  Without murmur, clicks, rubs or gallops Abdomen:  Very distended , firm , tense, dull on percussion , no tenderness . Normal bowel sounds. No appreciable masses or hepatomegaly.  No rebound or guarding.  Rectal:  Not performed. Msk:  Symmetrical without gross deformities.  Extremities:  3+ b/l pitting  edema, cyanosis or clubbing. Neurologic:  Alert and oriented x3;  grossly normal neurologically. Skin:  Intact without significant lesions or rashes.Loss of axillary hair.  Cervical Nodes:  No significant cervical adenopathy. Psych:  Alert and cooperative. Normal affect.  LAB RESULTS:  Recent Labs  11/27/16 1339 11/28/16 0107  WBC 3.9 3.2*  HGB 7.9* 7.3*  HCT 25.2* 23.5*  PLT 226 181   BMET  Recent Labs  11/27/16 1339 11/28/16 0107  NA 137 138  K 2.8* 2.9*  CL 100* 101  CO2 31 31  GLUCOSE 111* 92  BUN 19 18  CREATININE 0.87 0.98  CALCIUM 8.2* 8.1*   LFT  Recent Labs  11/27/16 1339  PROT 6.9  ALBUMIN 2.7*  AST 100*  ALT 42    ALKPHOS 120  BILITOT 1.2   PT/INR  Recent Labs  11/27/16 1451  LABPROT 16.3*  INR 1.30    STUDIES: Dg Chest 2 View  Result Date: 11/28/2016 CLINICAL DATA:  Edema, shortness of breath. EXAM: CHEST  2 VIEW COMPARISON:  Radiographs of November 27, 2016. FINDINGS: The heart size and mediastinal contours are within normal limits. Both lungs are clear. No pneumothorax or pleural effusion is noted. Stable left old rib fractures are noted. IMPRESSION: No active cardiopulmonary disease. Electronically Signed   By: Marijo Conception, M.D.   On: 11/28/2016 07:56   Dg Chest 2 View  Result Date: 11/27/2016 CLINICAL DATA:  66 year old male with shortness of breath with exertion for 1 week and intermittent chest pain. EXAM: CHEST  2 VIEW COMPARISON:  None. FINDINGS: Chronic appearing multilevel left lateral rib fractures. Low normal lung volumes. Mediastinal contours are normal. Visualized tracheal air column is within normal limits. No pneumothorax, pulmonary edema, pleural effusion or confluent pulmonary opacity. No acute osseous abnormality identified. Negative visible bowel gas pattern. IMPRESSION: No acute cardiopulmonary abnormality. Electronically Signed   By: Genevie Ann M.D.   On: 11/27/2016 14:07      Impression / Plan:   Shane Bradley is a 66 y.o. y/o male with abnormal LFT,s , iron deficiency anemia and elevated AST.     1. Microcytic iron deficiency  Anemia with no overt blood loss- Suggest IV iron, EGD+colonoscopy +/- capsule study , celiac serology . Check b12,folate , tsh . He also has a leucopenia but normal platelet count- suggest to follow and if remains low despite IV iron then may need hematology input . Will plan for endoscopy once electrolytes have been normalized. Note potassium been low yesterday and today despite replacement - will order magnesium  . Differentials are wide at this time   2. Low albumin with no protein in urine- clinically he has ascites and a history of alcohol  consumption on a long term basis for many years. He may very well have decompensated alcoholic liver disease. Will get a diagnostic and therapeutic paracentesis to evaluate the ascites. If negative for liver disease then will need further imaging to rule out occult malignancy in chest or abdomen . Low salt diet - < 2 grams of sodium per day   3. Elevated AST- recheck along with GGT and CK to determine if its from the liver or from muscle.    Thank you for involving me in the care of this patient.      LOS: 1 day   Shane Bellows, MD  11/28/2016, 9:37 AM

## 2016-11-28 NOTE — Progress Notes (Addendum)
[facility] NAME: Shane Bradley    MR#:  [id number]  DATE OF BIRTH:  [birth year]  SUBJECTIVE: Admitted for shortness of breath, anasarca. Found to have ascites.   CHIEF COMPLAINT:   Chief Complaint  Patient presents with  . Shortness of Breath    REVIEW OF SYSTEMS:   ROS CONSTITUTIONAL: No fever, fatigue or weakness.  EYES: No blurred or double vision.  EARS, NOSE, AND THROAT: No tinnitus or ear pain.  RESPIRATORY: No cough, shortness of breath, wheezing or hemoptysis.  CARDIOVASCULAR: No chest pain, orthopnea, edema.  GASTROINTESTINAL: No nausea, vomiting, diarrhea . abdominal swelling abdominal pain.  GENITOURINARY: No dysuria, hematuria.  ENDOCRINE: No polyuria, nocturia,  HEMATOLOGY: has  anemia,  No easy bruising or bleeding SKIN: No rash or lesion. MUSCULOSKELETAL: No joint pain or arthritis.   NEUROLOGIC: No tingling, numbness, weakness.  PSYCHIATRY: No anxiety or depression.   DRUG ALLERGIES:  No Known Allergies  VITALS:  Blood pressure (!) 145/63, pulse 74, temperature 98.2 F (36.8 C), temperature source Oral, resp. rate 18, height 5\' 7"  (1.702 m), weight 102.1 kg (225 lb), SpO2 98 %.  PHYSICAL EXAMINATION:  GENERAL:  66 y.o.-year-old patient lying in the bed with no acute distress.  EYES: Pupils equal, round, reactive to light  No scleral icterus. Extraocular muscles intact.  HEENT: Head atraumatic, normocephalic. Oropharynx and nasopharynx clear.  NECK:  Supple, no jugular venous distention. No thyroid enlargement, no tenderness.  LUNGS: Normal breath sounds bilaterally, no wheezing, rales,rhonchi or crepitation. No use of accessory muscles of respiration.  CARDIOVASCULAR: S1, S2 normal. No murmurs, rubs, or gallops.  ABDOMEN: Soft, Noted with ascites, bilateral present, bowel sounds present. EXTREMITIES: 1+ pitting edema bilaterally up to knees.  NEUROLOGIC: Cranial nerves II through XII are intact.  Muscle strength 5/5 in all extremities. Sensation intact. Gait not checked.  PSYCHIATRIC: The patient is alert and oriented x 3.  SKIN: No obvious rash, lesion, or ulcer.    LABORATORY PANEL:   CBC  Recent Labs Lab 11/28/16 0107  WBC 3.2*  HGB 7.3*  HCT 23.5*  PLT 181   ------------------------------------------------------------------------------------------------------------------  Chemistries   Recent Labs Lab 11/27/16 1339 11/28/16 0107  NA 137 138  K 2.8* 2.9*  CL 100* 101  CO2 31 31  GLUCOSE 111* 92  BUN 19 18  CREATININE 0.87 0.98  CALCIUM 8.2* 8.1*  AST 100*  --   ALT 42  --   ALKPHOS 120  --   BILITOT 1.2  --    ------------------------------------------------------------------------------------------------------------------  Cardiac Enzymes  Recent Labs Lab 11/28/16 0107  TROPONINI <0.03   ------------------------------------------------------------------------------------------------------------------  RADIOLOGY:  Dg Chest 2 View  Result Date: 11/28/2016 CLINICAL DATA:  Edema, shortness of breath. EXAM: CHEST  2 VIEW COMPARISON:  Radiographs of November 27, 2016. FINDINGS: The heart size and mediastinal contours are within normal limits. Both lungs are clear. No pneumothorax or pleural effusion is noted. Stable left old rib fractures are noted. IMPRESSION: No active cardiopulmonary disease. Electronically Signed   By: Marijo Conception, M.D.   On: 11/28/2016 07:56   Dg Chest 2 View  Result Date: 11/27/2016 CLINICAL DATA:  66 year old male with shortness of breath with exertion for 1 week and intermittent chest pain. EXAM: CHEST  2 VIEW COMPARISON:  None. FINDINGS: Chronic appearing multilevel left lateral rib fractures. Low normal lung volumes. Mediastinal contours are normal. Visualized tracheal air column is within normal limits. No pneumothorax, pulmonary edema, pleural  effusion or confluent pulmonary opacity. No acute osseous abnormality identified.  Negative visible bowel gas pattern. IMPRESSION: No acute cardiopulmonary abnormality. Electronically Signed   By: Genevie Ann M.D.   On: 11/27/2016 14:07    EKG:   Orders placed or performed during the hospital encounter of 11/27/16  . ED EKG  . ED EKG  . EKG 12-Lead  . EKG 12-Lead    ASSESSMENT AND PLAN:  1.anasarca likely due to underlying Compensated liver disease. Patient was very heavy alcoholic as per his wife. Check ultrasound-guided paracentesis today. Does have microcytic anemia without any overt blood loss. Give IV iron., EGD and colonoscopy, check P29, folic acid, TSH level. Echocardiogram showed EF more than 50% with normal systolic function.Continue IV Lasix, potassium.  albumin also is ordered. #2 hypoalbuminemia. With heavy alcohol consumption before. Diagnostic and therapeutic paracentesis today. #3 elevated LFTs especially AST.   Hypokalemia.; Replace the potassium. D/w wife.,RN   All the records are reviewed and case discussed with Care Management/Social Workerr. Management plans discussed with the patient, family and they are in agreement.  CODE STATUS: Full code  TOTAL TIME TAKING CARE OF THIS PATIENT: 54minutes.   POSSIBLE D/C IN 1-2 DAYS, DEPENDING ON CLINICAL CONDITION.   Epifanio Lesches M.D on 11/28/2016 at 11:44 AM  Between 7am to 6pm - Pager - [phone]  After 6pm go to [email or web address] - password [email or web address] [facility]  Office  [phone]  CC: Primary care physician; Marden Noble, MD   Note: This dictation was prepared with Dragon dictation along with smaller phrase technology. Any transcriptional errors that result from this process are unintentional.

## 2016-11-28 NOTE — Progress Notes (Signed)
Nutrition Brief Note  Patient identified on the Malnutrition Screening Tool (MST) Report  Wt Readings from Last 15 Encounters:  11/27/16 225 lb (102.1 kg)    Body mass index is 35.24 kg/m. Patient meets criteria for obese class II based on current BMI.   Attempted to speak with patient but he tells me "I have been eating, fine, I have no weight loss. They told me not to eat or drink until after 2pm today." Patient was clearly uncomfortable, ascites noted He stated he was not hungry today Re-consult as needed  Current diet order is heart healthy, patient is consuming approximately 25%% of meals at this time. Labs and medications reviewed.   No nutrition interventions warranted at this time.  Satira Anis. Areeba Sulser, MS, RD LDN Inpatient Clinical Dietitian Pager [phone]

## 2016-11-28 NOTE — Procedures (Signed)
US guided paracentesis performed.  Removed 4 liters of milky yellow fluid.  Paracentesis stopped after 4 liters due to first drainage.  Residual ascites remaining.  Minimal blood loss and no immediate complication.

## 2016-11-28 NOTE — Consult Note (Signed)
[facility] Consultation Note  Patient ID: Shane Bradley, MRN: [id number], DOB/AGE: April 13, 1951 66 y.o. Admit date: 11/27/2016   Date of Consult: 11/28/2016 Primary Physician: Marden Noble, MD Primary Cardiologist:None  Chief Complaint:  Chief Complaint  Patient presents with  . Shortness of Breath   Reason for Consult: anasarca and lower extremity edema  HPI: 66 y.o. male with the essential hypertension and mixed hyperlipidemia who has had no evidence of previous cardiovascular history having significant progression of 15 pound weight gain and anasarca as well as severe lower extremity edema over the last 3-4 weeks. Patient has not had any chest discomfort or significant amount of worsening shortness of breath although is more weak and fatigued. The patient has had an EKG showing normal sinus rhythm and a normal troponin without evidence of myocardial infarction. Chest x-ray shows no evidence of pulmonary edema. Echocardiogram has shown normal LV systolic function with ejection fraction is 65% and no evidence of significant valvular heart disease or pulmonary hypertension. Therefore it appears that the patient does have other causes of anasarca and lower extremity edema  Past Medical History:  Diagnosis Date  . Back complaints   . Hypertension       Surgical History: History reviewed. No pertinent surgical history.   Home Meds: Prior to Admission medications   Medication Sig Start Date End Date Taking? Authorizing Provider  hydrochlorothiazide (HYDRODIURIL) 25 MG tablet Take 25 mg by mouth daily. 10/11/16  Yes [provider]  meloxicam (MOBIC) 15 MG tablet Take 15 mg by mouth daily. 11/15/16  Yes [provider]  oxyCODONE-acetaminophen (PERCOCET) 10-325 MG tablet Take 1 tablet by mouth every 6 (six) hours as needed. 11/15/16  Yes [provider]  tamsulosin (FLOMAX) 0.4 MG CAPS capsule Take 0.4 mg by mouth daily. 11/23/16  Yes [provider]    Inpatient Medications:  . furosemide  20 mg Intravenous Q8H  . heparin  5,000 Units Subcutaneous Q8H  . meloxicam  15 mg Oral Daily  . pantoprazole  40 mg Oral BID  . tamsulosin  0.4 mg Oral Daily     Allergies: No Known Allergies  Social History   Social History  . Marital status: Married    Spouse name: N/A  . Number of children: N/A  . Years of education: N/A   Occupational History  . Not on file.   Social History Main Topics  . Smoking status: Former Research scientist (life sciences)  . Smokeless tobacco: Never Used  . Alcohol use No  . Drug use: No  . Sexual activity: Not on file   Other Topics Concern  . Not on file   Social History Narrative  . No narrative on file     Family History  Problem Relation Age of Onset  . Kidney cancer Mother   . CAD Father      Review of Systems Positive forAnasarca and lower extremity edema Negative for: General:  chills, fever, night sweats or weight changes.  Cardiovascular: PND orthopnea syncope dizziness  Dermatological skin lesions rashes Respiratory: Cough congestion Urologic: Frequent urination urination at night and hematuria Abdominal: negative for nausea, vomiting, diarrhea, bright red blood per rectum, melena, or hematemesis Neurologic: negative for visual changes, and/or hearing changes  All other systems reviewed and are otherwise negative except as noted above.  Labs:  Recent Labs  11/27/16 1339 11/27/16 1935 11/28/16 0107  TROPONINI <0.03 <0.03 <0.03   Lab Results  Component Value Date   WBC 3.2 (L) 11/28/2016  HGB 7.3 (L) 11/28/2016   HCT 23.5 (L) 11/28/2016   MCV 70.4 (L) 11/28/2016   PLT 181 11/28/2016    Recent Labs Lab 11/27/16 1339 11/28/16 0107  NA 137 138  K 2.8* 2.9*  CL 100* 101  CO2 31 31  BUN 19 18  CREATININE 0.87 0.98  CALCIUM 8.2* 8.1*  PROT 6.9  --   BILITOT 1.2  --   ALKPHOS 120  --   ALT 42  --   AST 100*  --   GLUCOSE 111* 92   No results found for: CHOL, HDL,  LDLCALC, TRIG No results found for: DDIMER  Radiology/Studies:  Dg Chest 2 View  Result Date: 11/28/2016 CLINICAL DATA:  Edema, shortness of breath. EXAM: CHEST  2 VIEW COMPARISON:  Radiographs of November 27, 2016. FINDINGS: The heart size and mediastinal contours are within normal limits. Both lungs are clear. No pneumothorax or pleural effusion is noted. Stable left old rib fractures are noted. IMPRESSION: No active cardiopulmonary disease. Electronically Signed   By: Marijo Conception, M.D.   On: 11/28/2016 07:56   Dg Chest 2 View  Result Date: 11/27/2016 CLINICAL DATA:  66 year old male with shortness of breath with exertion for 1 week and intermittent chest pain. EXAM: CHEST  2 VIEW COMPARISON:  None. FINDINGS: Chronic appearing multilevel left lateral rib fractures. Low normal lung volumes. Mediastinal contours are normal. Visualized tracheal air column is within normal limits. No pneumothorax, pulmonary edema, pleural effusion or confluent pulmonary opacity. No acute osseous abnormality identified. Negative visible bowel gas pattern. IMPRESSION: No acute cardiopulmonary abnormality. Electronically Signed   By: Genevie Ann M.D.   On: 11/27/2016 14:07    CBS:WHQPRF sinus rhythm. Normal EKG  Weights: Filed Weights   11/27/16 1338  Weight: 102.1 kg (225 lb)     Physical Exam: Blood pressure 131/80, pulse 79, temperature 98.3 F (36.8 C), temperature source Oral, resp. rate 18, height 5\' 7"  (1.702 m), weight 102.1 kg (225 lb), SpO2 97 %. Body mass index is 35.24 kg/m. General: Well developed, well nourished, in no acute distress. Head eyes ears nose throat: Normocephalic, atraumatic, sclera non-icteric, no xanthomas, nares are without discharge. No apparent thyromegaly and/or mass  Lungs: Normal respiratory effort.  no wheezes, no rales, no rhonchi.  Heart: RRR with normal S1 S2. no murmur gallop, no rub, PMI is normal size and placement, carotid upstroke normal without bruit, jugular venous  pressure is normal Abdomen: Tense,  tender,  distended with normoactive bowel sounds. No hepatomegaly. No rebound/guarding. No obvious abdominal masses. Abdominal aorta is normal size without bruit Extremities: 1-2+ edema. no cyanosis, no clubbing, no ulcers  Peripheral : 2+ bilateral upper extremity pulses, 2+ bilateral femoral pulses, 1 + bilateral dorsal pedal pulse Neuro: Alert and oriented. No facial asymmetry. No focal deficit. Moves all extremities spontaneously. Musculoskeletal: Normal muscle tone without kyphosis Psych:  Responds to questions appropriately with a normal affect.    Assessment: 66 year old male with essential hypertension having significant anasarca and lower extremity edema not likely secondary to cardiac causes with a normal LV function by echocardiogram and no evidence of pulmonary edema chest x-ray without evidence of myocardial infarction  Plan: 1. Continue supportive care of anasarca and lower extremity edema with intravenous Lasix and other treatment options 2. No further cardiac diagnostics at this time due to no evidence of apparent primary cardiac cause of symptoms 3. Further treatment of potential anemia as the primary cause of current condition and/or other liver issues  Signed, Corey Skains M.D. [facility] 11/28/2016, 8:54 AM

## 2016-11-28 NOTE — Care Management (Signed)
Admitted with increasing fatigue, shortness of breath and lower extremities. swelling.  Cardiac work up is negative for cardiac etiology. GI is consulting. Paracentesis pending for ascites.  Independent in all adls, denies issues accessing medical care, obtaining medications or with transportation.  Current with  PCP.

## 2016-11-29 LAB — COMPREHENSIVE METABOLIC PANEL
ALT: 38 U/L (ref 17–63)
AST: 89 U/L — AB (ref 15–41)
Albumin: 2.8 g/dL — ABNORMAL LOW (ref 3.5–5.0)
Alkaline Phosphatase: 115 U/L (ref 38–126)
Anion gap: 8 (ref 5–15)
BILIRUBIN TOTAL: 1.3 mg/dL — AB (ref 0.3–1.2)
BUN: 16 mg/dL (ref 6–20)
CO2: 30 mmol/L (ref 22–32)
Calcium: 8.4 mg/dL — ABNORMAL LOW (ref 8.9–10.3)
Chloride: 101 mmol/L (ref 101–111)
Creatinine, Ser: 0.95 mg/dL (ref 0.61–1.24)
Glucose, Bld: 159 mg/dL — ABNORMAL HIGH (ref 65–99)
POTASSIUM: 2.9 mmol/L — AB (ref 3.5–5.1)
Sodium: 139 mmol/L (ref 135–145)
TOTAL PROTEIN: 6.6 g/dL (ref 6.5–8.1)

## 2016-11-29 LAB — CBC
HEMATOCRIT: 23.7 % — AB (ref 40.0–52.0)
Hemoglobin: 7.6 g/dL — ABNORMAL LOW (ref 13.0–18.0)
MCH: 22.4 pg — ABNORMAL LOW (ref 26.0–34.0)
MCHC: 31.9 g/dL — ABNORMAL LOW (ref 32.0–36.0)
MCV: 70.4 fL — ABNORMAL LOW (ref 80.0–100.0)
PLATELETS: 191 10*3/uL (ref 150–440)
RBC: 3.37 MIL/uL — ABNORMAL LOW (ref 4.40–5.90)
RDW: 19 % — AB (ref 11.5–14.5)
WBC: 2.7 10*3/uL — AB (ref 3.8–10.6)

## 2016-11-29 LAB — HEPATITIS B E ANTIGEN: HEP B E AG: NEGATIVE

## 2016-11-29 LAB — URINALYSIS, ROUTINE W REFLEX MICROSCOPIC
Bilirubin Urine: NEGATIVE
GLUCOSE, UA: NEGATIVE mg/dL
HGB URINE DIPSTICK: NEGATIVE
KETONES UR: NEGATIVE mg/dL
LEUKOCYTES UA: NEGATIVE
NITRITE: NEGATIVE
PH: 7 (ref 5.0–8.0)
PROTEIN: NEGATIVE mg/dL
SPECIFIC GRAVITY, URINE: 1.006 (ref 1.005–1.030)

## 2016-11-29 LAB — TRIGLYCERIDES, BODY FLUIDS: TRIGLYCERIDES FL: 148 mg/dL

## 2016-11-29 LAB — HEPATITIS C ANTIBODY: HCV Ab: >11 s/co ratio — ABNORMAL HIGH (ref 0.0–0.9)

## 2016-11-29 LAB — HEPATITIS B SURFACE ANTIGEN: HEP B S AG: NEGATIVE

## 2016-11-29 LAB — FOLATE: Folate: 29 ng/mL (ref >5.9)

## 2016-11-29 LAB — GLUCOSE, CAPILLARY: Glucose-Capillary: 137 mg/dL — ABNORMAL HIGH (ref 65–99)

## 2016-11-29 LAB — HIV ANTIBODY (ROUTINE TESTING W REFLEX): HIV SCREEN 4TH GENERATION: NONREACTIVE

## 2016-11-29 LAB — T4, FREE: Free T4: 0.81 ng/dL (ref 0.61–1.12)

## 2016-11-29 LAB — MAGNESIUM: Magnesium: 1.5 mg/dL — ABNORMAL LOW (ref 1.7–2.4)

## 2016-11-29 LAB — VITAMIN B12: Vitamin B-12: 1337 pg/mL — ABNORMAL HIGH (ref 180–914)

## 2016-11-29 MED ORDER — DOCUSATE SODIUM 100 MG PO CAPS
100.0000 mg | ORAL_CAPSULE | Freq: Two times a day (BID) | ORAL | Status: DC
  Administered 2016-11-29 – 2016-11-30 (×2): 100 mg via ORAL
  Filled 2016-11-29: qty 1

## 2016-11-29 MED ORDER — POTASSIUM CHLORIDE 10 MEQ/100ML IV SOLN
10.0000 meq | INTRAVENOUS | Status: AC
  Administered 2016-11-29 (×4): 10 meq via INTRAVENOUS
  Filled 2016-11-29 (×4): qty 100

## 2016-11-29 MED ORDER — SPIRONOLACTONE 25 MG PO TABS
50.0000 mg | ORAL_TABLET | Freq: Every day | ORAL | Status: DC
  Administered 2016-11-29 – 2016-11-30 (×2): 50 mg via ORAL
  Filled 2016-11-29 (×2): qty 2

## 2016-11-29 MED ORDER — IRON SUCROSE 20 MG/ML IV SOLN
100.0000 mg | Freq: Once | INTRAVENOUS | Status: AC
  Administered 2016-11-29: 100 mg via INTRAVENOUS
  Filled 2016-11-29: qty 5

## 2016-11-29 MED ORDER — MAGNESIUM SULFATE 2 GM/50ML IV SOLN
2.0000 g | Freq: Once | INTRAVENOUS | Status: AC
  Administered 2016-11-29: 2 g via INTRAVENOUS
  Filled 2016-11-29: qty 50

## 2016-11-29 MED ORDER — POTASSIUM CHLORIDE CRYS ER 20 MEQ PO TBCR
40.0000 meq | EXTENDED_RELEASE_TABLET | ORAL | Status: AC
  Administered 2016-11-29 (×2): 40 meq via ORAL
  Filled 2016-11-29 (×2): qty 2

## 2016-11-29 NOTE — Evaluation (Signed)
Physical Therapy Evaluation Patient Details Name: WILLIS HOLQUIN MRN: [id number] DOB: September 18, 1950 Today's Date: 11/29/2016   History of Present Illness  Kirk Basquez is a 66 y.o. male with a known history of hypertension- takes his medication regularly and go for checkup every year. For last 1 week started noticing swelling on his legs which is gradually coming up and now up under his abdominal wall. He is also increasingly getting short of breath with minimal activities like walking from his car to the parking lot. He denies any chest pain, palpitation, bleeding in stool or vomiting. He went to walk-in clinic and after examining him he was advised to go to emergency room for further management. In ER he is noted to have significant edema and his hemoglobin is 7.9 but as per ER physician his stool guaiac was negative. He is now admitted for anasarca, weakness, and hypokalemia/hypomagnesemia. Pt reports his strength has returned and his mobility is at baseline  Clinical Impression  Pt is independent with bed mobility, transfers, and ambulation. He is able to complete a full lap around RN station. Able to perform horizontal and vertical head turns as well as gait speed changes without lateral gait deviation. Normal 180 degree turns. No abnormalities noted in gait. Single leg balance greater than 10 seconds and negative Rhomberg. No overt balance deficits identified. Pt has no identifiable PT needs at this time. Will complete order. Please enter new order if status or needs change.     Follow Up Recommendations No PT follow up    Equipment Recommendations  None recommended by PT    Recommendations for Other Services       Precautions / Restrictions Precautions Precautions: None Restrictions Weight Bearing Restrictions: No      Mobility  Bed Mobility Overal bed mobility: Independent             General bed mobility comments: good speed/sequencing  Transfers Overall transfer level:  Independent Equipment used: None             General transfer comment: Good speed, sequencing, and stability  Ambulation/Gait Ambulation/Gait assistance: Independent Ambulation Distance (Feet): 220 Feet Assistive device: None Gait Pattern/deviations: WFL(Within Functional Limits) Gait velocity: WFL for full community mobility Gait velocity interpretation: at or above normal speed for age/gender General Gait Details: Pt able to complete a full lap around RN station. Able to perform horizontal and vertical head turns as well as gait speed changes without lateral gait deviation. Normal 180 degree turns. No abnormalities noted  Stairs            Wheelchair Mobility    Modified Rankin (Stroke Patients Only)       Balance Overall balance assessment: Independent   Sitting balance-Leahy Scale: Normal     Standing balance support: No upper extremity supported Standing balance-Leahy Scale: Normal Standing balance comment: Single leg balance is >10 seconds. Negative Rhomberg                             Pertinent Vitals/Pain Pain Assessment: No/denies pain    Home Living Family/patient expects to be discharged to:: Private residence Living Arrangements: Spouse/significant other Available Help at Discharge: Family Type of Home: House Home Access: Stairs to enter Entrance Stairs-Rails: Can reach both Entrance Stairs-Number of Steps: 2 Home Layout: One level Home Equipment: Wren - single point;Wheelchair - manual;Walker - standard      Prior Function Level of Independence: Independent  Comments: Independent with ADLs/IADLs. No falls     Hand Dominance   Dominant Hand: Right    Extremity/Trunk Assessment   Upper Extremity Assessment Upper Extremity Assessment: Overall WFL for tasks assessed    Lower Extremity Assessment Lower Extremity Assessment: Overall WFL for tasks assessed       Communication   Communication: No difficulties   Cognition Arousal/Alertness: Awake/alert Behavior During Therapy: WFL for tasks assessed/performed Overall Cognitive Status: Within Functional Limits for tasks assessed                                        General Comments      Exercises     Assessment/Plan    PT Assessment Patent does not need any further PT services  PT Problem List         PT Treatment Interventions      PT Goals (Current goals can be found in the Care Plan section)  Acute Rehab PT Goals PT Goal Formulation: All assessment and education complete, DC therapy    Frequency     Barriers to discharge        Co-evaluation               AM-PAC PT "6 Clicks" Daily Activity  Outcome Measure Difficulty turning over in bed (including adjusting bedclothes, sheets and blankets)?: None Difficulty moving from lying on back to sitting on the side of the bed? : None Difficulty sitting down on and standing up from a chair with arms (e.g., wheelchair, bedside commode, etc,.)?: None Help needed moving to and from a bed to chair (including a wheelchair)?: None Help needed walking in hospital room?: None Help needed climbing 3-5 steps with a railing? : None 6 Click Score: 24    End of Session Equipment Utilized During Treatment: Gait belt Activity Tolerance: Patient tolerated treatment well Patient left: in bed;with call bell/phone within reach;with family/visitor present   PT Visit Diagnosis: Muscle weakness (generalized) (M62.81)    Time: 6546-5035 PT Time Calculation (min) (ACUTE ONLY): 14 min   Charges:   PT Evaluation $PT Eval Low Complexity: 1 Procedure     PT G Codes:   PT G-Codes **NOT FOR INPATIENT CLASS** Functional Assessment Tool Used: AM-PAC 6 Clicks Basic Mobility Functional Limitation: Mobility: Walking and moving around Mobility: Walking and Moving Around Current Status (W6568): 0 percent impaired, limited or restricted Mobility: Walking and Moving Around Goal  Status (L2751): 0 percent impaired, limited or restricted Mobility: Walking and Moving Around Discharge Status (Z0017): 0 percent impaired, limited or restricted    Phillips Grout PT, DPT    Floy Riegler 11/29/2016, 5:14 PM

## 2016-11-29 NOTE — Progress Notes (Signed)
[facility] Encounter Note  Patient: Shane Bradley / Admit Date: 11/27/2016 / Date of Encounter: 11/29/2016, 8:17 AM   Subjective: Patient feels better after paracentesis. Slightly improved shortness of breath. Lower extremity slightly improved. No evidence of congestive heart failure or chest discomfort Echocardiogram showing normal LV systolic function with ejection fraction of 55-60% without evidence of significant valvular heart disease or pulmonary hypertension  Review of Systems: Positive for: Abdominal discomfort and distention Negative for: Vision change, hearing change, syncope, dizziness, nausea, vomiting,diarrhea, bloody stool, cough, congestion, diaphoresis, urinary frequency, urinary pain,skin lesions, skin rashes Others previously listed  Objective: Telemetry: Normal sinus rhythm Physical Exam: Blood pressure 118/64, pulse 73, temperature 97.6 F (36.4 C), temperature source Oral, resp. rate 16, height 5\' 7"  (1.702 m), weight 98.2 kg (216 lb 8 oz), SpO2 95 %. Body mass index is 33.91 kg/m. General: Well developed, well nourished, in no acute distress. Head: Normocephalic, atraumatic, sclera non-icteric, no xanthomas, nares are without discharge. Neck: No apparent masses Lungs: Normal respirations with no wheezes, no rhonchi, no rales , no crackles   Heart: Regular rate and rhythm, normal S1 S2, no murmur, no rub, no gallop, PMI is normal size and placement, carotid upstroke normal without bruit, jugular venous pressure normal Abdomen: Soft, non-tender,  distended with normoactive bowel sounds. No hepatosplenomegaly. Abdominal aorta is normal size without bruit Extremities: 1-2+ edema, no clubbing, no cyanosis, no ulcers,  Peripheral: 2+ radial, 2+ femoral, 2+ dorsal pedal pulses Neuro: Alert and oriented. Moves all extremities spontaneously. Psych:  Responds to questions appropriately with a normal affect.   Intake/Output Summary (Last 24 hours) at  11/29/16 0817 Last data filed at 11/29/16 0630  Gross per 24 hour  Intake              260 ml  Output             1170 ml  Net             -910 ml    Inpatient Medications:  . furosemide  20 mg Intravenous Q8H  . heparin  5,000 Units Subcutaneous Q8H  . meloxicam  15 mg Oral Daily  . pantoprazole  40 mg Oral BID  . tamsulosin  0.4 mg Oral Daily   Infusions:   Labs:  Recent Labs  11/27/16 1339 11/28/16 0107 11/28/16 1009  NA 137 138  --   K 2.8* 2.9*  --   CL 100* 101  --   CO2 31 31  --   GLUCOSE 111* 92  --   BUN 19 18  --   CREATININE 0.87 0.98  --   CALCIUM 8.2* 8.1*  --   MG  --   --  1.4*    Recent Labs  11/27/16 1339 11/28/16 1009  AST 100* 97*  ALT 42 41  ALKPHOS 120 129*  BILITOT 1.2 1.1  PROT 6.9 6.6  ALBUMIN 2.7* 2.5*    Recent Labs  11/27/16 1339 11/28/16 0107  WBC 3.9 3.2*  NEUTROABS 2.4  --   HGB 7.9* 7.3*  HCT 25.2* 23.5*  MCV 70.2* 70.4*  PLT 226 181    Recent Labs  11/27/16 1339 11/27/16 1935 11/28/16 0107 11/28/16 1009  CKTOTAL  --   --   --  297  TROPONINI <0.03 <0.03 <0.03  --    Invalid input(s): POCBNP No results for input(s): HGBA1C in the last 72 hours.   Weights: Filed Weights   11/27/16 1338 11/29/16 0543  Weight: 102.1 kg (225 lb) 98.2 kg (216 lb 8 oz)     Radiology/Studies:  Dg Chest 2 View  Result Date: 11/28/2016 CLINICAL DATA:  Edema, shortness of breath. EXAM: CHEST  2 VIEW COMPARISON:  Radiographs of November 27, 2016. FINDINGS: The heart size and mediastinal contours are within normal limits. Both lungs are clear. No pneumothorax or pleural effusion is noted. Stable left old rib fractures are noted. IMPRESSION: No active cardiopulmonary disease. Electronically Signed   By: Marijo Conception, M.D.   On: 11/28/2016 07:56   Dg Chest 2 View  Result Date: 11/27/2016 CLINICAL DATA:  66 year old male with shortness of breath with exertion for 1 week and intermittent chest pain. EXAM: CHEST  2 VIEW COMPARISON:   None. FINDINGS: Chronic appearing multilevel left lateral rib fractures. Low normal lung volumes. Mediastinal contours are normal. Visualized tracheal air column is within normal limits. No pneumothorax, pulmonary edema, pleural effusion or confluent pulmonary opacity. No acute osseous abnormality identified. Negative visible bowel gas pattern. IMPRESSION: No acute cardiopulmonary abnormality. Electronically Signed   By: Genevie Ann M.D.   On: 11/27/2016 14:07   US Paracentesis  Result Date: 11/28/2016 INDICATION: 66 year old with ascites and abnormal LFTs. EXAM: ULTRASOUND GUIDED PARACENTESIS MEDICATIONS: None. COMPLICATIONS: None immediate. PROCEDURE: Informed written consent was obtained from the patient after a discussion of the risks, benefits and alternatives to treatment. A timeout was performed prior to the initiation of the procedure. Initial ultrasound scanning demonstrates a large amount of ascites within the right lower abdominal quadrant. The right lower abdomen was prepped and draped in the usual sterile fashion. 1% lidocaine was used for local anesthesia. Following this, a 6 Fr Safe-T-Centesis catheter was introduced. An ultrasound image was saved for documentation purposes. The paracentesis was performed. The catheter was removed and a dressing was applied. The patient tolerated the procedure well without immediate post procedural complication. FINDINGS: A total of approximately 4 L of milky yellow fluid was removed. Paracentesis was stopped after 4 L because this is the patient's first drainage. There was residual ascites at the end of procedure. Samples were sent to the laboratory as requested by the clinical team. IMPRESSION: Successful ultrasound-guided paracentesis yielding 4 liters of peritoneal fluid. Electronically Signed   By: Markus Daft M.D.   On: 11/28/2016 16:13   US Abdomen Limited Ruq  Result Date: 11/28/2016 CLINICAL DATA:  Abnormal LFTs. EXAM: ULTRASOUND ABDOMEN LIMITED RIGHT  UPPER QUADRANT COMPARISON:  None. FINDINGS: Gallbladder: Gallbladder is mildly distended but no evidence for wall thickening and no gallstones. Common bile duct: Diameter: 0.3 cm. Liver: Liver has a diffusely nodular contour and suggestive for cirrhosis. No focal liver lesion is identified. Large amount of ascites in the right upper quadrant surrounding the liver and gallbladder. Main portal vein is patent. Other:  Large amount of ascites in all 4 quadrants of the abdomen. IMPRESSION: Liver is heterogeneous and diffusely nodular. Findings are compatible with cirrhosis. Large amount of ascites. Electronically Signed   By: Markus Daft M.D.   On: 11/28/2016 16:03     Assessment and Recommendation  66 y.o. male with the significant history of alcohol abuse in the past essential hypertension mixed hyperlipidemia significant anemia and low albumin with the significant anasarca and lower extremity edema without evidence of myocardial infarction or heart failure most consistent with liver cirrhosis 1. No further cardiac workup or treatment options at this time due to no evidence of myocardial infarction or heart failure type symptoms 2. Paracentesis as necessary for  improvements of anasarca 3. Diuretics as necessary to help with continued edema 4. Call if further questions  Signed, Serafina Royals M.D. FACC

## 2016-11-29 NOTE — Progress Notes (Signed)
[facility] [place] NAME: Shane Bradley    MR#:  [id number]  DATE OF BIRTH:  27-Jun-1950  SUBJECTIVE: Admitted for shortness of breath, anasarca. Found to have ascites. Patient had paracentesis on June 13 had 4 L of fluid extraction. Shortness of breath is better but feeling weak and tired   CHIEF COMPLAINT:   Chief Complaint  Patient presents with  . Shortness of Breath  Patient had paracentesis on June 13 had 4 L of fluid extraction. Shortness of breath is better but feeling weak and tired   REVIEW OF SYSTEMS:   ROS CONSTITUTIONAL: No fever, fatigue or weakness.  EYES: No blurred or double vision.  EARS, NOSE, AND THROAT: No tinnitus or ear pain.  RESPIRATORY: No cough, shortness of breath, wheezing or hemoptysis.  CARDIOVASCULAR: No chest pain, orthopnea, edema.  GASTROINTESTINAL: No nausea, vomiting, diarrhea .Decreased abdominal distention following up paracentesis GENITOURINARY: No dysuria, hematuria.  ENDOCRINE: No polyuria, nocturia,  HEMATOLOGY: has  anemia,  No easy bruising or bleeding SKIN: No rash or lesion. MUSCULOSKELETAL: No joint pain or arthritis.   NEUROLOGIC: No tingling, numbness, weakness.  PSYCHIATRY: No anxiety or depression.   DRUG ALLERGIES:  No Known Allergies  VITALS:  Blood pressure 118/64, pulse 73, temperature 97.6 F (36.4 C), temperature source Oral, resp. rate 16, height 5\' 7"  (1.702 m), weight 98.2 kg (216 lb 8 oz), SpO2 95 %.  PHYSICAL EXAMINATION:  GENERAL:  66 y.o.-year-old patient lying in the bed with no acute distress.  EYES: Pupils equal, round, reactive to light  No scleral icterus. Extraocular muscles intact.  HEENT: Head atraumatic, normocephalic. Oropharynx and nasopharynx clear.  NECK:  Supple, no jugular venous distention. No thyroid enlargement, no tenderness.  LUNGS: Normal breath sounds bilaterally, no wheezing, rales,rhonchi or crepitation. No use of accessory muscles of  respiration.  CARDIOVASCULAR: S1, S2 normal. No murmurs, rubs, or gallops.  ABDOMEN: Soft, Noted with ascites, bowel sounds present. Status post paracentesis EXTREMITIES: 1+ pitting edema bilaterally up to knees.  NEUROLOGIC: Cranial nerves II through XII are intact. Muscle strength 5/5 in all extremities. Sensation intact. Gait not checked.  PSYCHIATRIC: The patient is alert and oriented x 3.  SKIN: No obvious rash, lesion, or ulcer.    LABORATORY PANEL:   CBC  Recent Labs Lab 11/29/16 1015  WBC 2.7*  HGB 7.6*  HCT 23.7*  PLT 191   ------------------------------------------------------------------------------------------------------------------  Chemistries   Recent Labs Lab 11/29/16 1015  NA 139  K 2.9*  CL 101  CO2 30  GLUCOSE 159*  BUN 16  CREATININE 0.95  CALCIUM 8.4*  MG 1.5*  AST 89*  ALT 38  ALKPHOS 115  BILITOT 1.3*   ------------------------------------------------------------------------------------------------------------------  Cardiac Enzymes  Recent Labs Lab 11/28/16 0107  TROPONINI <0.03   ------------------------------------------------------------------------------------------------------------------  RADIOLOGY:  Dg Chest 2 View  Result Date: 11/28/2016 CLINICAL DATA:  Edema, shortness of breath. EXAM: CHEST  2 VIEW COMPARISON:  Radiographs of November 27, 2016. FINDINGS: The heart size and mediastinal contours are within normal limits. Both lungs are clear. No pneumothorax or pleural effusion is noted. Stable left old rib fractures are noted. IMPRESSION: No active cardiopulmonary disease. Electronically Signed   By: Marijo Conception, M.D.   On: 11/28/2016 07:56   Dg Chest 2 View  Result Date: 11/27/2016 CLINICAL DATA:  66 year old male with shortness of breath with exertion for 1 week and intermittent chest pain. EXAM: CHEST  2 VIEW COMPARISON:  None. FINDINGS: Chronic  appearing multilevel left lateral rib fractures. Low normal lung volumes.  Mediastinal contours are normal. Visualized tracheal air column is within normal limits. No pneumothorax, pulmonary edema, pleural effusion or confluent pulmonary opacity. No acute osseous abnormality identified. Negative visible bowel gas pattern. IMPRESSION: No acute cardiopulmonary abnormality. Electronically Signed   By: Genevie Ann M.D.   On: 11/27/2016 14:07   US Paracentesis  Result Date: 11/28/2016 INDICATION: 66 year old with ascites and abnormal LFTs. EXAM: ULTRASOUND GUIDED PARACENTESIS MEDICATIONS: None. COMPLICATIONS: None immediate. PROCEDURE: Informed written consent was obtained from the patient after a discussion of the risks, benefits and alternatives to treatment. A timeout was performed prior to the initiation of the procedure. Initial ultrasound scanning demonstrates a large amount of ascites within the right lower abdominal quadrant. The right lower abdomen was prepped and draped in the usual sterile fashion. 1% lidocaine was used for local anesthesia. Following this, a 6 Fr Safe-T-Centesis catheter was introduced. An ultrasound image was saved for documentation purposes. The paracentesis was performed. The catheter was removed and a dressing was applied. The patient tolerated the procedure well without immediate post procedural complication. FINDINGS: A total of approximately 4 L of milky yellow fluid was removed. Paracentesis was stopped after 4 L because this is the patient's first drainage. There was residual ascites at the end of procedure. Samples were sent to the laboratory as requested by the clinical team. IMPRESSION: Successful ultrasound-guided paracentesis yielding 4 liters of peritoneal fluid. Electronically Signed   By: Markus Daft M.D.   On: 11/28/2016 16:13   US Abdomen Limited Ruq  Result Date: 11/28/2016 CLINICAL DATA:  Abnormal LFTs. EXAM: ULTRASOUND ABDOMEN LIMITED RIGHT UPPER QUADRANT COMPARISON:  None. FINDINGS: Gallbladder: Gallbladder is mildly distended but no  evidence for wall thickening and no gallstones. Common bile duct: Diameter: 0.3 cm. Liver: Liver has a diffusely nodular contour and suggestive for cirrhosis. No focal liver lesion is identified. Large amount of ascites in the right upper quadrant surrounding the liver and gallbladder. Main portal vein is patent. Other:  Large amount of ascites in all 4 quadrants of the abdomen. IMPRESSION: Liver is heterogeneous and diffusely nodular. Findings are compatible with cirrhosis. Large amount of ascites. Electronically Signed   By: Markus Daft M.D.   On: 11/28/2016 16:03    EKG:   Orders placed or performed during the hospital encounter of 11/27/16  . ED EKG  . ED EKG  . EKG 12-Lead  . EKG 12-Lead    ASSESSMENT AND PLAN:  1.anasarca likely due to underlying Compensated liver disease.  Patient was very heavy alcoholic as per his wife.  S/p  ultrasound-guided paracentesis 6/13.  Does have microcytic anemia without any overt blood loss. Give IV iron , EGD and colonoscopy, check L89, folic acid OP Echocardiogram showed EF more than 50% with normal systolic function. Cardiology recommended no further evaluation from the standpoint  #Positive hepatitis C Check hepatitis C RNA viral load Outpatient follow-up with GI in a week for follow-up    # hypoalbuminemia. With heavy alcohol consumption before. Consult dietitian   # elevated LFTs especially AST.  #Hypokalemia and hypomagnesemia.; Replace the potassium and magnesium. Once the electrolytes were repleted we'll start the patient on Lasix 20 mg once daily and aldactone 50 mg once daily D/w wife.,RN  #Generalized weakness PT evaluation  #Elevated TSH but free T4 is normal PCP to repeat thyroid function tests in 6 weeks   All the records are reviewed and case discussed with Care Management/Social Workerr. Management  plans discussed with the patient, family and they are in agreement.  CODE STATUS: Full code  TOTAL TIME TAKING CARE OF THIS  PATIENT: 66minutes.   POSSIBLE D/C IN 1-2 DAYS, DEPENDING ON CLINICAL CONDITION.   Nicholes Mango M.D on 11/29/2016 at 1:36 PM  Between 7am to 6pm - Pager - [phone]  After 6pm go to [email or web address] - password [email or web address] [facility]  Office  [phone]  CC: Primary care physician; Marden Noble, MD   Note: This dictation was prepared with Dragon dictation along with smaller phrase technology. Any transcriptional errors that result from this process are unintentional.

## 2016-11-29 NOTE — Progress Notes (Signed)
[facility] rounding unit visited with Pt that nurse had indicted could benefit from chaplain's visit. [place] met pt, pt's wife was at the bedside. CH had conversation with Pt centered on his health and concerns he may have. Pt who appeared to be anxious but never talked about it, stated he was waiting for the dc to tell him how things were doing with his health.  Pt requested prayers for healing and for his family, which [facility] provided.    11/29/16 1000  Clinical Encounter Type  Visited With Patient;Family  Visit Type Initial;Spiritual support  Referral From Nurse  Consult/Referral To Chaplain  Spiritual Encounters  Spiritual Needs Prayer;Other (Comment)

## 2016-11-29 NOTE — Progress Notes (Signed)
Shane Bellows MD, MRCP(U.K) [address]  [place]  [place], [place] [address]  Main: [place] is being followed for ascites, liver disease  Day 2 of follow up   Subjective:  Feels better , no shortness of breath   Objective: Vital signs in last 24 hours: Vitals:   11/28/16 1543 11/28/16 1556 11/28/16 1944 11/29/16 0543  BP: (!) 173/73 (!) 176/72 (!) 152/63 118/64  Pulse: 80 82 91 73  Resp:   18 16  Temp:   98.6 F (37 C) 97.6 F (36.4 C)  TempSrc:   Oral Oral  SpO2: 99% 98% 100% 95%  Weight:    216 lb 8 oz (98.2 kg)  Height:       Weight change: -8 lb 8 oz (-3.856 kg)  Intake/Output Summary (Last 24 hours) at 11/29/16 1055 Last data filed at 11/29/16 0630  Gross per 24 hour  Intake              260 ml  Output             1170 ml  Net             -910 ml     Exam: Heart:: Regular rate and rhythm, S1S2 present or without murmur or extra heart sounds Lungs: normal, clear to auscultation and clear to auscultation and percussion Abdomen: soft,mldly distened , nontender, normal bowel sounds   Lab Results: CBC Latest Ref Rng & Units 11/29/2016 11/28/2016 11/27/2016  WBC 3.8 - 10.6 K/uL 2.7(L) 3.2(L) 3.9  Hemoglobin 13.0 - 18.0 g/dL 7.6(L) 7.3(L) 7.9(L)  Hematocrit 40.0 - 52.0 % 23.7(L) 23.5(L) 25.2(L)  Platelets 150 - 440 K/uL 191 181 226   Hepatic Function Latest Ref Rng & Units 11/28/2016 11/27/2016  Total Protein 6.5 - 8.1 g/dL 6.6 6.9  Albumin 3.5 - 5.0 g/dL 2.5(L) 2.7(L)  AST 15 - 41 U/L 97(H) 100(H)  ALT 17 - 63 U/L 41 42  Alk Phosphatase 38 - 126 U/L 129(H) 120  Total Bilirubin 0.3 - 1.2 mg/dL 1.1 1.2  Bilirubin, Direct 0.1 - 0.5 mg/dL 0.4 -      Micro Results: Recent Results (from the past 240 hour(s))  Body fluid culture     Status: None (Preliminary result)   Collection Time: 11/28/16  3:37 PM  Result Value Ref Range Status   Specimen Description PERITONEAL  Final   Special Requests NONE  Final   Gram Stain   Final   RARE WBC PRESENT,BOTH PMN AND MONONUCLEAR NO ORGANISMS SEEN    Culture   Final    NO GROWTH < 24 HOURS Performed at [facility], [address] [place], [place] [address]    Report Status PENDING  Incomplete   Studies/Results: Dg Chest 2 View  Result Date: 11/28/2016 CLINICAL DATA:  Edema, shortness of breath. EXAM: CHEST  2 VIEW COMPARISON:  Radiographs of November 27, 2016. FINDINGS: The heart size and mediastinal contours are within normal limits. Both lungs are clear. No pneumothorax or pleural effusion is noted. Stable left old rib fractures are noted. IMPRESSION: No active cardiopulmonary disease. Electronically Signed   By: Marijo Conception, M.D.   On: 11/28/2016 07:56   Dg Chest 2 View  Result Date: 11/27/2016 CLINICAL DATA:  66 year old male with shortness of breath with exertion for 1 week and intermittent chest pain. EXAM: CHEST  2 VIEW COMPARISON:  None. FINDINGS: Chronic appearing multilevel left lateral rib fractures. Low  normal lung volumes. Mediastinal contours are normal. Visualized tracheal air column is within normal limits. No pneumothorax, pulmonary edema, pleural effusion or confluent pulmonary opacity. No acute osseous abnormality identified. Negative visible bowel gas pattern. IMPRESSION: No acute cardiopulmonary abnormality. Electronically Signed   By: Genevie Ann M.D.   On: 11/27/2016 14:07   US Paracentesis  Result Date: 11/28/2016 INDICATION: 66 year old with ascites and abnormal LFTs. EXAM: ULTRASOUND GUIDED PARACENTESIS MEDICATIONS: None. COMPLICATIONS: None immediate. PROCEDURE: Informed written consent was obtained from the patient after a discussion of the risks, benefits and alternatives to treatment. A timeout was performed prior to the initiation of the procedure. Initial ultrasound scanning demonstrates a large amount of ascites within the right lower abdominal quadrant. The right lower abdomen was prepped and draped in the usual sterile fashion. 1% lidocaine  was used for local anesthesia. Following this, a 6 Fr Safe-T-Centesis catheter was introduced. An ultrasound image was saved for documentation purposes. The paracentesis was performed. The catheter was removed and a dressing was applied. The patient tolerated the procedure well without immediate post procedural complication. FINDINGS: A total of approximately 4 L of milky yellow fluid was removed. Paracentesis was stopped after 4 L because this is the patient's first drainage. There was residual ascites at the end of procedure. Samples were sent to the laboratory as requested by the clinical team. IMPRESSION: Successful ultrasound-guided paracentesis yielding 4 liters of peritoneal fluid. Electronically Signed   By: Markus Daft M.D.   On: 11/28/2016 16:13   US Abdomen Limited Ruq  Result Date: 11/28/2016 CLINICAL DATA:  Abnormal LFTs. EXAM: ULTRASOUND ABDOMEN LIMITED RIGHT UPPER QUADRANT COMPARISON:  None. FINDINGS: Gallbladder: Gallbladder is mildly distended but no evidence for wall thickening and no gallstones. Common bile duct: Diameter: 0.3 cm. Liver: Liver has a diffusely nodular contour and suggestive for cirrhosis. No focal liver lesion is identified. Large amount of ascites in the right upper quadrant surrounding the liver and gallbladder. Main portal vein is patent. Other:  Large amount of ascites in all 4 quadrants of the abdomen. IMPRESSION: Liver is heterogeneous and diffusely nodular. Findings are compatible with cirrhosis. Large amount of ascites. Electronically Signed   By: Markus Daft M.D.   On: 11/28/2016 16:03   Medications: I have reviewed the patient's current medications. Scheduled Meds: . heparin  5,000 Units Subcutaneous Q8H  . meloxicam  15 mg Oral Daily  . pantoprazole  40 mg Oral BID  . spironolactone  50 mg Oral Daily  . tamsulosin  0.4 mg Oral Daily   Continuous Infusions: PRN Meds:.docusate sodium, oxyCODONE-acetaminophen **AND** oxyCODONE   Assessment: Principal  Problem:   Anasarca Active Problems:   Symptomatic anemia JAQUAVEON BILAL is a 66 y.o. y/o male with decomepensated  cirrhosis of the liver ,  iron deficiency anemia and ascites. Likely secondary to prior alcohol abuse +/- hepatitis C  RUQ USG yesterday shows cirrhosis , ascites, underwent 4 litres of paracentesis . No evidence of SBP but the cell count is predominantly lymphocytic. Cultures negative so far , lipase, tryglycerides pending ,SAAG >1.1 suggestive of portal hypertension with 97% accuracy . HIV, Hep B  Negative. Hep C antibody positive  Plan:  1. Replace electrolytes- Mg 1.4 yesterday and had low potassium .  2. Once electrolytes are replaced - commence on lasix 20 mg and aldactone 50 mg - recheck BMP in 3 days 3. Low salt diet < 2 grams sodium per day  4. Daily weights 5. EGD+colonoscopy +/- capsule study , celiac serology,  urine analysis . Check b12,folate , tsh - can be done as an outpatient  6. Check Hepatitis C viral load and genotype - will discuss treatment if positive. 7. IV iron for iron deficiency 8. F/u in my clinic in 7-10 days   I will sign off.  Please call me if any further GI concerns or questions.  We would like to thank you for the opportunity to participate in the care of TERRACE FONTANILLA.    LOS: 2 days   Shane Bradley 11/29/2016, 10:55 AM

## 2016-11-30 LAB — COMPREHENSIVE METABOLIC PANEL
ALBUMIN: 2.6 g/dL — AB (ref 3.5–5.0)
ALT: 36 U/L (ref 17–63)
ANION GAP: 4 — AB (ref 5–15)
AST: 84 U/L — ABNORMAL HIGH (ref 15–41)
Alkaline Phosphatase: 112 U/L (ref 38–126)
BILIRUBIN TOTAL: 1.1 mg/dL (ref 0.3–1.2)
BUN: 17 mg/dL (ref 6–20)
CHLORIDE: 103 mmol/L (ref 101–111)
CO2: 33 mmol/L — ABNORMAL HIGH (ref 22–32)
Calcium: 8 mg/dL — ABNORMAL LOW (ref 8.9–10.3)
Creatinine, Ser: 0.9 mg/dL (ref 0.61–1.24)
GFR calc Af Amer: >60 mL/min (ref >60)
GFR calc non Af Amer: >60 mL/min (ref >60)
GLUCOSE: 103 mg/dL — AB (ref 65–99)
POTASSIUM: 3.4 mmol/L — AB (ref 3.5–5.1)
Sodium: 140 mmol/L (ref 135–145)
TOTAL PROTEIN: 6 g/dL — AB (ref 6.5–8.1)

## 2016-11-30 LAB — CBC
HEMATOCRIT: 23.7 % — AB (ref 40.0–52.0)
Hemoglobin: 7.6 g/dL — ABNORMAL LOW (ref 13.0–18.0)
MCH: 23 pg — ABNORMAL LOW (ref 26.0–34.0)
MCHC: 32 g/dL (ref 32.0–36.0)
MCV: 71.7 fL — ABNORMAL LOW (ref 80.0–100.0)
PLATELETS: 183 10*3/uL (ref 150–440)
RBC: 3.31 MIL/uL — ABNORMAL LOW (ref 4.40–5.90)
RDW: 19.1 % — AB (ref 11.5–14.5)
WBC: 3 10*3/uL — ABNORMAL LOW (ref 3.8–10.6)

## 2016-11-30 LAB — MISC LABCORP TEST (SEND OUT): LABCORP TEST CODE: 19588

## 2016-11-30 LAB — HCV RNA QUANT
HCV QUANT LOG: 5.686 {Log_IU}/mL (ref >1.70)
HCV QUANT: 485000 [IU]/mL (ref >50)

## 2016-11-30 LAB — MAGNESIUM: MAGNESIUM: 1.8 mg/dL (ref 1.7–2.4)

## 2016-11-30 LAB — CYTOLOGY - NON PAP

## 2016-11-30 MED ORDER — POTASSIUM CHLORIDE CRYS ER 20 MEQ PO TBCR
40.0000 meq | EXTENDED_RELEASE_TABLET | ORAL | Status: AC
  Administered 2016-11-30 (×2): 40 meq via ORAL
  Filled 2016-11-30 (×2): qty 2

## 2016-11-30 MED ORDER — DOCUSATE SODIUM 100 MG PO CAPS: 100.0000 mg | ORAL_CAPSULE | Freq: Two times a day (BID) | ORAL | 0 refills | Status: AC | PRN

## 2016-11-30 MED ORDER — FUROSEMIDE 20 MG PO TABS: 20.0000 mg | ORAL_TABLET | Freq: Every day | ORAL | 0 refills | Status: AC

## 2016-11-30 MED ORDER — FUROSEMIDE 20 MG PO TABS
20.0000 mg | ORAL_TABLET | Freq: Every day | ORAL | Status: DC
  Administered 2016-11-30: 20 mg via ORAL
  Filled 2016-11-30: qty 1

## 2016-11-30 MED ORDER — SPIRONOLACTONE 50 MG PO TABS: 50.0000 mg | ORAL_TABLET | Freq: Every day | ORAL | 0 refills | Status: AC

## 2016-11-30 MED ORDER — OMEPRAZOLE 20 MG PO CPDR: 20.0000 mg | DELAYED_RELEASE_CAPSULE | Freq: Every day | ORAL | 1 refills | Status: AC

## 2016-11-30 NOTE — Plan of Care (Signed)
Problem: Physical Regulation: Goal: Ability to maintain clinical measurements within normal limits will improve Outcome: Not Progressing Still hypokalemic today at 3.4. Is receiving PO replacement Q 4 hrs. Wenda Low [facility]

## 2016-11-30 NOTE — Discharge Instructions (Addendum)
Shortness of Breath, Adult Shortness of breath is when a person has trouble breathing enough air, or when a person feels like she or he is having trouble breathing in enough air. Shortness of breath could be a sign of medical problem. Follow these instructions at home: Pay attention to any changes in your symptoms. Take these actions to help with your condition:  Do not smoke. Smoking is a common cause of shortness of breath. If you smoke and you need help quitting, ask your health care provider.  Avoid things that can irritate your airways, such as: ? Mold. ? Dust. ? Air pollution. ? Chemical fumes. ? Things that can cause allergy symptoms (allergens), if you have allergies.  Keep your living space clean and free of mold and dust.  Rest as needed. Slowly return to your usual activities.  Take over-the-counter and prescription medicines, including oxygen and inhaled medicines, only as told by your health care provider.  Keep all follow-up visits as told by your health care provider. This is important.  Contact a health care provider if:  Your condition does not improve as soon as expected.  You have a hard time doing your normal activities, even after you rest.  You have new symptoms. Get help right away if:  Your shortness of breath gets worse.  You have shortness of breath when you are resting.  You feel light-headed or you faint.  You have a cough that is not controlled with medicines.  You cough up blood.  You have pain with breathing.  You have pain in your chest, arms, shoulders, or abdomen.  You have a fever.  You cannot walk up stairs or exercise the way that you normally do. This information is not intended to replace advice given to you by your health care provider. Make sure you discuss any questions you have with your health care provider. Document Released: 02/27/2001 Document Revised: 12/24/2015 Document Reviewed: 11/10/2015 Elsevier Interactive Patient  Education  2018 [address] with primary care physician in a week Follow-up with gastroenterology in a week Cardiac diet Stop drinking alcohol

## 2016-11-30 NOTE — Discharge Summary (Signed)
[facility] NAME: Shane Bradley    MR#:  [id number]  DATE OF BIRTH:  Sep 26, 1950  DATE OF ADMISSION:  11/27/2016 ADMITTING PHYSICIAN: Vaughan Basta, MD  DATE OF DISCHARGE: 11/30/16 PRIMARY CARE PHYSICIAN: Marden Noble, MD    ADMISSION DIAGNOSIS:  Edema [R60.9] Hypokalemia [E87.6] SOB (shortness of breath) [R06.02] Other ascites [R18.8] Hypervolemia, unspecified hypervolemia type [E87.70] Anemia, unspecified type [D64.9]  DISCHARGE DIAGNOSIS:  Principal Problem:   Anasarca Active Problems:   Symptomatic anemia   SECONDARY DIAGNOSIS:   Past Medical History:  Diagnosis Date  . Back complaints   . Hypertension     HOSPITAL COURSE:  HISTORY OF PRESENT ILLNESS: Shane Bradley  is a 66 y.o. male with a known history of Hypertension- takes his medication regularly and go for checkup every year. For last 1 week started noticing swelling on his legs which is gradually coming up and now up under his abdominal wall. He is also increasingly getting short of breath with minimal activities like walking from his car to the parking lot. He denies any chest pain, palpitation, bleeding in stool or vomiting. He went to walk-in clinic and after examining him he was advised to go to emergency room for further management. In ER he is noted to have significant edema and his hemoglobin is 7.9 but as per ER physician his stool guaiac was negative  1.anasarca due to underlying  liver disease. 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  S/p  ultrasound-guided paracentesis 6/13.  Does have microcytic anemia without any overt blood loss. Received IV iron , EGD and colonoscopy, check M84, folic acid OP Echocardiogram showed EF more than 50% with normal systolic function. Cardiology recommended no further evaluation from the standpoint  #Positive  hepatitis C Check hepatitis C RNA viral load, GI to follow up Outpatient follow-up with GI in a week for follow-up    # hypoalbuminemia. With heavy alcohol consumption before. Consulted dietitian -heart healthy diet  # elevated LFTs especially AST. Outpatient follow-up with GI  #Hypokalemia and hypomagnesemia.; Replaced potassium and magnesium. As the  electrolytes were repleted we'll start the patient on Lasix 20 mg once daily and aldactone 50 mg once daily D/w wife.,RN  #Generalized weakness PT evaluation- no PT needs identified  #Elevated TSH but free T4 is normal PCP to repeat thyroid function tests in 6 weeks  DISCHARGE CONDITIONS:  Stable  CONSULTS OBTAINED:  Treatment Team:  Corey Skains, MD Jonathon Bellows, MD   PROCEDURES Ultrasound-guided paracentesis  DRUG ALLERGIES:  No Known Allergies  DISCHARGE MEDICATIONS:   Current Discharge Medication List    START taking these medications   Details  docusate sodium (COLACE) 100 MG capsule Take 1 capsule (100 mg total) by mouth 2 (two) times daily as needed for mild constipation. Qty: 10 capsule, Refills: 0    furosemide (LASIX) 20 MG tablet Take 1 tablet (20 mg total) by mouth daily. Qty: 30 tablet, Refills: 0    omeprazole (PRILOSEC) 20 MG capsule Take 1 capsule (20 mg total) by mouth daily. Qty: 30 capsule, Refills: 1    spironolactone (ALDACTONE) 50 MG tablet Take 1 tablet (50 mg total) by mouth daily. Qty: 30 tablet, Refills: 0      CONTINUE these medications which have NOT CHANGED   Details  hydrochlorothiazide (HYDRODIURIL) 25 MG tablet Take 25 mg by mouth daily.  meloxicam (MOBIC) 15 MG tablet Take 15 mg by mouth daily.    oxyCODONE-acetaminophen (PERCOCET) 10-325 MG tablet Take 1 tablet by mouth every 6 (six) hours as needed.    tamsulosin (FLOMAX) 0.4 MG CAPS capsule Take 0.4 mg by mouth daily.         DISCHARGE INSTRUCTIONS:   Follow-up with primary care physician in a  week Follow-up with gastroenterology in a week Cardiac diet Stop drinking alcohol   DIET:  Cardiac diet  DISCHARGE CONDITION:  Stable  ACTIVITY:  Activity as tolerated  OXYGEN:  Home Oxygen: No.   Oxygen Delivery: room air  DISCHARGE LOCATION:  home   If you experience worsening of your admission symptoms, develop shortness of breath, life threatening emergency, suicidal or homicidal thoughts you must seek medical attention immediately by calling 911 or calling your MD immediately  if symptoms less severe.  You Must read complete instructions/literature along with all the possible adverse reactions/side effects for all the Medicines you take and that have been prescribed to you. Take any new Medicines after you have completely understood and accpet all the possible adverse reactions/side effects.   Please note  You were cared for by a hospitalist during your hospital stay. If you have any questions about your discharge medications or the care you received while you were in the hospital after you are discharged, you can call the unit and asked to speak with the hospitalist on call if the hospitalist that took care of you is not available. Once you are discharged, your primary care physician will handle any further medical issues. Please note that NO REFILLS for any discharge medications will be authorized once you are discharged, as it is imperative that you return to your primary care physician (or establish a relationship with a primary care physician if you do not have one) for your aftercare needs so that they can reassess your need for medications and monitor your lab values.     Today  Chief Complaint  Patient presents with  . Shortness of Breath   Patient is doing much better shortness of breath improved wants to go home no PT needs identified  ROS:  CONSTITUTIONAL: Denies fevers, chills. Denies any fatigue, weakness.  EYES: Denies blurry vision, double vision, eye  pain. EARS, NOSE, THROAT: Denies tinnitus, ear pain, hearing loss. RESPIRATORY: Denies cough, wheeze, shortness of breath.  CARDIOVASCULAR: Denies chest pain, palpitations, edema.  GASTROINTESTINAL: Denies nausea, vomiting, diarrhea, abdominal pain. Denies bright red blood per rectum. GENITOURINARY: Denies dysuria, hematuria. ENDOCRINE: Denies nocturia or thyroid problems. HEMATOLOGIC AND LYMPHATIC: Denies easy bruising or bleeding. SKIN: Denies rash or lesion. MUSCULOSKELETAL: Denies pain in neck, back, shoulder, knees, hips or arthritic symptoms.  NEUROLOGIC: Denies paralysis, paresthesias.  PSYCHIATRIC: Denies anxiety or depressive symptoms.   VITAL SIGNS:  Blood pressure (!) 151/82, pulse 79, temperature 98 F (36.7 C), temperature source Oral, resp. rate 19, height 5\' 7"  (1.702 m), weight 98.5 kg (217 lb 3.2 oz), SpO2 100 %.  I/O:    Intake/Output Summary (Last 24 hours) at 11/30/16 1351 Last data filed at 11/29/16 1700  Gross per 24 hour  Intake                0 ml  Output              200 ml  Net             -200 ml    PHYSICAL EXAMINATION:  GENERAL:  66 y.o.-year-old patient  lying in the bed with no acute distress.  EYES: Pupils equal, round, reactive to light and accommodation. No scleral icterus. Extraocular muscles intact.  HEENT: Head atraumatic, normocephalic. Oropharynx and nasopharynx clear.  NECK:  Supple, no jugular venous distention. No thyroid enlargement, no tenderness.  LUNGS: Normal breath sounds bilaterally, no wheezing, rales,rhonchi or crepitation. No use of accessory muscles of respiration.  CARDIOVASCULAR: S1, S2 normal. No murmurs, rubs, or gallops.  ABDOMEN: Soft, non-tender, Some distention from ascites. Bowel sounds present.  EXTREMITIES: Trace pedal edema, no cyanosis, or clubbing.  NEUROLOGIC: Cranial nerves II through XII are intact. Muscle strength 5/5 in all extremities. Sensation intact. Gait not checked.  PSYCHIATRIC: The patient is alert  and oriented x 3.  SKIN: No obvious rash, lesion, or ulcer.   DATA REVIEW:   CBC  Recent Labs Lab 11/30/16 0413  WBC 3.0*  HGB 7.6*  HCT 23.7*  PLT 183    Chemistries   Recent Labs Lab 11/30/16 0413  NA 140  K 3.4*  CL 103  CO2 33*  GLUCOSE 103*  BUN 17  CREATININE 0.90  CALCIUM 8.0*  MG 1.8  AST 84*  ALT 36  ALKPHOS 112  BILITOT 1.1    Cardiac Enzymes  Recent Labs Lab 11/28/16 0107  TROPONINI <0.03    Microbiology Results  Results for orders placed or performed during the hospital encounter of 11/27/16  Body fluid culture     Status: None (Preliminary result)   Collection Time: 11/28/16  3:37 PM  Result Value Ref Range Status   Specimen Description PERITONEAL  Final   Special Requests NONE  Final   Gram Stain   Final    RARE WBC PRESENT,BOTH PMN AND MONONUCLEAR NO ORGANISMS SEEN    Culture   Final    NO GROWTH 2 DAYS Performed at [facility], [address] [place], [place] [address]    Report Status PENDING  Incomplete    RADIOLOGY:  Dg Chest 2 View  Result Date: 11/28/2016 CLINICAL DATA:  Edema, shortness of breath. EXAM: CHEST  2 VIEW COMPARISON:  Radiographs of November 27, 2016. FINDINGS: The heart size and mediastinal contours are within normal limits. Both lungs are clear. No pneumothorax or pleural effusion is noted. Stable left old rib fractures are noted. IMPRESSION: No active cardiopulmonary disease. Electronically Signed   By: Marijo Conception, M.D.   On: 11/28/2016 07:56   Dg Chest 2 View  Result Date: 11/27/2016 CLINICAL DATA:  66 year old male with shortness of breath with exertion for 1 week and intermittent chest pain. EXAM: CHEST  2 VIEW COMPARISON:  None. FINDINGS: Chronic appearing multilevel left lateral rib fractures. Low normal lung volumes. Mediastinal contours are normal. Visualized tracheal air column is within normal limits. No pneumothorax, pulmonary edema, pleural effusion or confluent pulmonary opacity. No acute  osseous abnormality identified. Negative visible bowel gas pattern. IMPRESSION: No acute cardiopulmonary abnormality. Electronically Signed   By: Genevie Ann M.D.   On: 11/27/2016 14:07   US Paracentesis  Result Date: 11/28/2016 INDICATION: 66 year old with ascites and abnormal LFTs. EXAM: ULTRASOUND GUIDED PARACENTESIS MEDICATIONS: None. COMPLICATIONS: None immediate. PROCEDURE: Informed written consent was obtained from the patient after a discussion of the risks, benefits and alternatives to treatment. A timeout was performed prior to the initiation of the procedure. Initial ultrasound scanning demonstrates a large amount of ascites within the right lower abdominal quadrant. The right lower abdomen was prepped and draped in the usual sterile fashion. 1% lidocaine  was used for local anesthesia. Following this, a 6 Fr Safe-T-Centesis catheter was introduced. An ultrasound image was saved for documentation purposes. The paracentesis was performed. The catheter was removed and a dressing was applied. The patient tolerated the procedure well without immediate post procedural complication. FINDINGS: A total of approximately 4 L of milky yellow fluid was removed. Paracentesis was stopped after 4 L because this is the patient's first drainage. There was residual ascites at the end of procedure. Samples were sent to the laboratory as requested by the clinical team. IMPRESSION: Successful ultrasound-guided paracentesis yielding 4 liters of peritoneal fluid. Electronically Signed   By: Markus Daft M.D.   On: 11/28/2016 16:13   US Abdomen Limited Ruq  Result Date: 11/28/2016 CLINICAL DATA:  Abnormal LFTs. EXAM: ULTRASOUND ABDOMEN LIMITED RIGHT UPPER QUADRANT COMPARISON:  None. FINDINGS: Gallbladder: Gallbladder is mildly distended but no evidence for wall thickening and no gallstones. Common bile duct: Diameter: 0.3 cm. Liver: Liver has a diffusely nodular contour and suggestive for cirrhosis. No focal liver lesion is  identified. Large amount of ascites in the right upper quadrant surrounding the liver and gallbladder. Main portal vein is patent. Other:  Large amount of ascites in all 4 quadrants of the abdomen. IMPRESSION: Liver is heterogeneous and diffusely nodular. Findings are compatible with cirrhosis. Large amount of ascites. Electronically Signed   By: Markus Daft M.D.   On: 11/28/2016 16:03    EKG:   Orders placed or performed during the hospital encounter of 11/27/16  . ED EKG  . ED EKG  . EKG 12-Lead  . EKG 12-Lead      Management plans discussed with the patient, family and they are in agreement.  CODE STATUS:     Code Status Orders        Start     Ordered   11/27/16 1903  Full code  Continuous     11/27/16 1902    Code Status History    Date Active Date Inactive Code Status Order ID Comments User Context   This patient has a current code status but no historical code status.      TOTAL TIME TAKING CARE OF THIS PATIENT: 45  minutes.   Note: This dictation was prepared with Dragon dictation along with smaller phrase technology. Any transcriptional errors that result from this process are unintentional.   @[facility] @  on 11/30/2016 at 1:51 PM  Between 7am to 6pm - Pager - [phone]  After 6pm go to [email or web address] - password [email or web address] [facility]  Office  [phone]  CC: Primary care physician; Marden Noble, MD

## 2016-12-02 LAB — BODY FLUID CULTURE: CULTURE: NO GROWTH

## 2016-12-03 LAB — LIPASE, FLUID: LIPASE-FLUID: 22 U/L

## 2016-12-03 LAB — TOTAL BILIRUBIN, BODY FLUID: Total bilirubin, fluid: 0.2 mg/dL

## 2016-12-05 ENCOUNTER — Other Ambulatory Visit: Payer: Self-pay

## 2016-12-05 DIAGNOSIS — B1710 Acute hepatitis C without hepatic coma: Secondary | ICD-10-CM

## 2016-12-11 ENCOUNTER — Other Ambulatory Visit
Admission: RE | Admit: 2016-12-11 | Discharge: 2016-12-11 | Disposition: A | Discharge status: Home / Self Care | Payer: 59 | Source: Ambulatory Visit | Attending: Gastroenterology | Admitting: Gastroenterology

## 2016-12-11 ENCOUNTER — Other Ambulatory Visit: Payer: Self-pay

## 2016-12-11 DIAGNOSIS — B1710 Acute hepatitis C without hepatic coma: Secondary | ICD-10-CM

## 2016-12-12 ENCOUNTER — Other Ambulatory Visit: Payer: Self-pay

## 2016-12-12 ENCOUNTER — Encounter: Payer: Self-pay | Admitting: Gastroenterology

## 2016-12-12 ENCOUNTER — Ambulatory Visit (INDEPENDENT_AMBULATORY_CARE_PROVIDER_SITE_OTHER): Payer: 59 | Admitting: Gastroenterology

## 2016-12-12 ENCOUNTER — Ambulatory Visit: Payer: Medicare Other | Admitting: Gastroenterology

## 2016-12-12 ENCOUNTER — Other Ambulatory Visit
Admission: RE | Admit: 2016-12-12 | Discharge: 2016-12-12 | Disposition: A | Discharge status: Home / Self Care | Payer: 59 | Source: Ambulatory Visit | Attending: Gastroenterology | Admitting: Gastroenterology

## 2016-12-12 VITALS — BP 116/62 | HR 88 | Temp 98.0°F | Ht 67.75 in | Wt 201.0 lb

## 2016-12-12 DIAGNOSIS — D509 Iron deficiency anemia, unspecified: Secondary | ICD-10-CM | POA: Diagnosis not present

## 2016-12-12 DIAGNOSIS — K7031 Alcoholic cirrhosis of liver with ascites: Secondary | ICD-10-CM | POA: Diagnosis present

## 2016-12-12 DIAGNOSIS — R188 Other ascites: Principal | ICD-10-CM

## 2016-12-12 DIAGNOSIS — B182 Chronic viral hepatitis C: Secondary | ICD-10-CM | POA: Diagnosis not present

## 2016-12-12 DIAGNOSIS — K7460 Unspecified cirrhosis of liver: Secondary | ICD-10-CM

## 2016-12-12 LAB — CBC WITH DIFFERENTIAL/PLATELET
BASOS PCT: 1 %
Basophils Absolute: 0 10*3/uL (ref 0–0.1)
Eosinophils Absolute: 0.1 10*3/uL (ref 0–0.7)
Eosinophils Relative: 2 %
HEMATOCRIT: 28.7 % — AB (ref 40.0–52.0)
Hemoglobin: 9.3 g/dL — ABNORMAL LOW (ref 13.0–18.0)
Lymphocytes Relative: 18 %
Lymphs Abs: 0.6 10*3/uL — ABNORMAL LOW (ref 1.0–3.6)
MCH: 24 pg — ABNORMAL LOW (ref 26.0–34.0)
MCHC: 32.4 g/dL (ref 32.0–36.0)
MCV: 74 fL — ABNORMAL LOW (ref 80.0–100.0)
MONO ABS: 0.5 10*3/uL (ref 0.2–1.0)
MONOS PCT: 14 %
NEUTROS ABS: 2.4 10*3/uL (ref 1.4–6.5)
Neutrophils Relative %: 65 %
Platelets: 234 10*3/uL (ref 150–440)
RBC: 3.87 MIL/uL — ABNORMAL LOW (ref 4.40–5.90)
RDW: 22.3 % — AB (ref 11.5–14.5)
WBC: 3.7 10*3/uL — ABNORMAL LOW (ref 3.8–10.6)

## 2016-12-12 LAB — COMPREHENSIVE METABOLIC PANEL
ALBUMIN: 3.2 g/dL — AB (ref 3.5–5.0)
ALK PHOS: 145 U/L — AB (ref 38–126)
ALT: 39 U/L (ref 17–63)
ANION GAP: 5 (ref 5–15)
AST: 79 U/L — ABNORMAL HIGH (ref 15–41)
BUN: 20 mg/dL (ref 6–20)
CALCIUM: 8.8 mg/dL — AB (ref 8.9–10.3)
CO2: 29 mmol/L (ref 22–32)
CREATININE: 1.52 mg/dL — AB (ref 0.61–1.24)
Chloride: 96 mmol/L — ABNORMAL LOW (ref 101–111)
GFR calc Af Amer: 54 mL/min — ABNORMAL LOW (ref >60)
GFR calc non Af Amer: 46 mL/min — ABNORMAL LOW (ref >60)
GLUCOSE: 103 mg/dL — AB (ref 65–99)
Potassium: 4.9 mmol/L (ref 3.5–5.1)
SODIUM: 130 mmol/L — AB (ref 135–145)
Total Bilirubin: 1.1 mg/dL (ref 0.3–1.2)
Total Protein: 7.8 g/dL (ref 6.5–8.1)

## 2016-12-12 LAB — PROTIME-INR
INR: 1.25
Prothrombin Time: 15.8 seconds — ABNORMAL HIGH (ref 11.4–15.2)

## 2016-12-12 MED ORDER — ALBUMIN HUMAN 25 % IV SOLN: 50.0000 g | Freq: Once | INTRAVENOUS | Status: AC

## 2016-12-12 NOTE — Progress Notes (Signed)
Jonathon Bellows MD, MRCP(U.K) [address]  [place]  [place], [place] [address]  Main: [phone]  Fax: [phone]   Primary Care Physician: Marden Noble, MD  Primary Gastroenterologist:  Dr. Jonathon Bellows   No chief complaint on file.   HPI: Shane Bradley is a 66 y.o. male   Summary of history :  I was consulted to se him when he was admitted on 11/27/16 for swelling of his legs and shortness of breath. Was also noted to have a Hb of 7.9 grams, microcytic iron deficiency anemia , mildly elevated transaminases. He has a history of alcohol abuse and treatment naive hepatitis C which we found on on the admission . We also found out he had cirrhosis of the liver, ascites , had 4 litres of ascites taken out . Cultures negative , SAAG > 1.1 suggestive of portal hypertension .   Interval history   11/29/2016-  12/12/2016  Feeling much better after discharge. Losing weight about a pound a day since discharge.   On lasix 20 mg and aldactone 50 mg a day , No salt in diet . Sleeps in the day and awake at night , No confusion. A few bowel movements a day which is soft .   Did not receive IV iron at the hospital. On oral iron tablets.   Current Outpatient Prescriptions  Medication Sig Dispense Refill  . docusate sodium (COLACE) 100 MG capsule Take 1 capsule (100 mg total) by mouth 2 (two) times daily as needed for mild constipation. 10 capsule 0  . furosemide (LASIX) 20 MG tablet Take 1 tablet (20 mg total) by mouth daily. 30 tablet 0  . hydrochlorothiazide (HYDRODIURIL) 25 MG tablet Take 25 mg by mouth daily.    . meloxicam (MOBIC) 15 MG tablet Take 15 mg by mouth daily.    Marland Kitchen omeprazole (PRILOSEC) 20 MG capsule Take 1 capsule (20 mg total) by mouth daily. 30 capsule 1  . oxyCODONE-acetaminophen (PERCOCET) 10-325 MG tablet Take 1 tablet by mouth every 6 (six) hours as needed.    Marland Kitchen spironolactone (ALDACTONE) 50 MG tablet Take 1 tablet (50 mg total) by mouth daily. 30 tablet 0  .  tamsulosin (FLOMAX) 0.4 MG CAPS capsule Take 0.4 mg by mouth daily.     No current facility-administered medications for this visit.     Allergies as of 12/12/2016  . (No Known Allergies)    ROS:  General: Negative for anorexia, weight loss, fever, chills, fatigue, weakness. ENT: Negative for hoarseness, difficulty swallowing , nasal congestion. CV: Negative for chest pain, angina, palpitations, dyspnea on exertion, peripheral edema.  Respiratory: Negative for dyspnea at rest, dyspnea on exertion, cough, sputum, wheezing.  GI: See history of present illness. GU:  Negative for dysuria, hematuria, urinary incontinence, urinary frequency, nocturnal urination.  Endo: Negative for unusual weight change.    Physical Examination:   There were no vitals taken for this visit.  General: Well-nourished, well-developed in no acute distress.  Eyes: No icterus. Conjunctivae pink. Mouth: Oropharyngeal mucosa moist and pink , no lesions erythema or exudate. Lungs: Clear to auscultation bilaterally. Non-labored. Heart: Regular rate and rhythm, no murmurs rubs or gallops.  Abdomen: Bowel sounds are normal, nontender, distended , ascites ++, no hepatosplenomegaly or masses, no abdominal bruits or hernia , no rebound or guarding.   Extremities: No lower extremity edema. No clubbing or deformities. Neuro: Alert and oriented x 3.  Grossly intact. Skin: Warm and dry, no jaundice.   Psych:  Alert and cooperative, normal mood and affect.   Imaging Studies: Dg Chest 2 View  Result Date: 11/28/2016 CLINICAL DATA:  Edema, shortness of breath. EXAM: CHEST  2 VIEW COMPARISON:  Radiographs of November 27, 2016. FINDINGS: The heart size and mediastinal contours are within normal limits. Both lungs are clear. No pneumothorax or pleural effusion is noted. Stable left old rib fractures are noted. IMPRESSION: No active cardiopulmonary disease. Electronically Signed   By: Marijo Conception, M.D.   On: 11/28/2016 07:56    Dg Chest 2 View  Result Date: 11/27/2016 CLINICAL DATA:  66 year old male with shortness of breath with exertion for 1 week and intermittent chest pain. EXAM: CHEST  2 VIEW COMPARISON:  None. FINDINGS: Chronic appearing multilevel left lateral rib fractures. Low normal lung volumes. Mediastinal contours are normal. Visualized tracheal air column is within normal limits. No pneumothorax, pulmonary edema, pleural effusion or confluent pulmonary opacity. No acute osseous abnormality identified. Negative visible bowel gas pattern. IMPRESSION: No acute cardiopulmonary abnormality. Electronically Signed   By: Genevie Ann M.D.   On: 11/27/2016 14:07   US Paracentesis  Result Date: 11/28/2016 INDICATION: 66 year old with ascites and abnormal LFTs. EXAM: ULTRASOUND GUIDED PARACENTESIS MEDICATIONS: None. COMPLICATIONS: None immediate. PROCEDURE: Informed written consent was obtained from the patient after a discussion of the risks, benefits and alternatives to treatment. A timeout was performed prior to the initiation of the procedure. Initial ultrasound scanning demonstrates a large amount of ascites within the right lower abdominal quadrant. The right lower abdomen was prepped and draped in the usual sterile fashion. 1% lidocaine was used for local anesthesia. Following this, a 6 Fr Safe-T-Centesis catheter was introduced. An ultrasound image was saved for documentation purposes. The paracentesis was performed. The catheter was removed and a dressing was applied. The patient tolerated the procedure well without immediate post procedural complication. FINDINGS: A total of approximately 4 L of milky yellow fluid was removed. Paracentesis was stopped after 4 L because this is the patient's first drainage. There was residual ascites at the end of procedure. Samples were sent to the laboratory as requested by the clinical team. IMPRESSION: Successful ultrasound-guided paracentesis yielding 4 liters of peritoneal fluid.  Electronically Signed   By: Markus Daft M.D.   On: 11/28/2016 16:13   US Abdomen Limited Ruq  Result Date: 11/28/2016 CLINICAL DATA:  Abnormal LFTs. EXAM: ULTRASOUND ABDOMEN LIMITED RIGHT UPPER QUADRANT COMPARISON:  None. FINDINGS: Gallbladder: Gallbladder is mildly distended but no evidence for wall thickening and no gallstones. Common bile duct: Diameter: 0.3 cm. Liver: Liver has a diffusely nodular contour and suggestive for cirrhosis. No focal liver lesion is identified. Large amount of ascites in the right upper quadrant surrounding the liver and gallbladder. Main portal vein is patent. Other:  Large amount of ascites in all 4 quadrants of the abdomen. IMPRESSION: Liver is heterogeneous and diffusely nodular. Findings are compatible with cirrhosis. Large amount of ascites. Electronically Signed   By: Markus Daft M.D.   On: 11/28/2016 16:03    Assessment and Plan:   Shane Bradley is a 66 y.o. y/o male here to follow up for  decomepensated  cirrhosis of the liver ,  iron deficiency anemia and ascites. Likely secondary to prior alcohol abuse +/- hepatitis C. RUQ USG  11/2016 shows cirrhosis , ascites HIV, Hep B  Negative. Hep C antibody positive  Plan:  1. Check CBC,CMP,INR today and if renal function permits will increase dose of lasix.  2. Low salt diet <  2 grams sodium per day  3. Daily weights 4. EGD+colonoscopy +/- capsule study to evaluate for iron deficiency anemia, . 5. F/u Hepatitis C  genotype -.Will refer to [facility] to consider treatment for Hep C due to decompensated cirrhosis.  6. PO iron for iron deficiency  I have discussed alternative options, risks & benefits,  which include, but are not limited to, bleeding, infection, perforation,respiratory complication & drug reaction.  The patient agrees with this plan & written consent will be obtained.      Dr Jonathon Bellows  MD,MRCP [facility]) Follow up in 2 months

## 2016-12-13 ENCOUNTER — Other Ambulatory Visit: Payer: Self-pay | Admitting: Gastroenterology

## 2016-12-13 ENCOUNTER — Telehealth: Payer: Self-pay

## 2016-12-13 NOTE — Telephone Encounter (Signed)
Advised pt of paracentesis scheduled for July 9th @ 1230 @ [facility].   Procedure on Wednesday July 11th @ [facility]

## 2016-12-14 LAB — HEPATITIS C GENOTYPE

## 2016-12-17 ENCOUNTER — Telehealth: Payer: Self-pay

## 2016-12-17 ENCOUNTER — Telehealth: Payer: Self-pay | Admitting: Gastroenterology

## 2016-12-17 NOTE — Telephone Encounter (Signed)
12/17/16 [employer] website for EGD 43235 / Colonoscopy 45378, K70.31, B18.2, D50.9 Auth #: [id number].

## 2016-12-17 NOTE — Telephone Encounter (Signed)
LVM voicemail on both numbers for Mr. Conran to contact [facility] @ [phone] to schedule his Hepatitis C Treatment.   Per [facility] they contacted him once with no response.  2nd attempt will be made then a letter sent out.

## 2016-12-18 ENCOUNTER — Telehealth: Payer: Self-pay

## 2016-12-18 ENCOUNTER — Other Ambulatory Visit: Payer: Self-pay

## 2016-12-18 ENCOUNTER — Telehealth: Payer: Self-pay | Admitting: Gastroenterology

## 2016-12-18 ENCOUNTER — Encounter: Payer: Self-pay | Admitting: *Deleted

## 2016-12-18 DIAGNOSIS — K7031 Alcoholic cirrhosis of liver with ascites: Secondary | ICD-10-CM

## 2016-12-18 NOTE — Telephone Encounter (Signed)
Advised Mr. Shane Bradley on upcoming appointments and locations.   Informed patient that [facility] attempted contact and was unable to locate him.  Patient stated his phone got wet and he was going today to get a new one. In the meantime patient requests calls be place to home phone or to spouse phone [phone])  Contacted [facility] and advised them of the changes.

## 2016-12-18 NOTE — Telephone Encounter (Signed)
Patient has some questions he needs to ask you.  Please call

## 2016-12-18 NOTE — Telephone Encounter (Signed)
LVM for patient per Dr. Georgeann Oppenheim result notes.   Inform creatinine has bumped up- stop aldactone,lasox completely and recheck BMP in 3 days

## 2016-12-20 NOTE — Discharge Instructions (Signed)
Paracentesis, Care After °Refer to this sheet in the next few weeks. These instructions provide you with information about caring for yourself after your procedure. Your health care provider may also give you more specific instructions. Your treatment has been planned according to current medical practices, but problems sometimes occur. Call your health care provider if you have any problems or questions after your procedure. °What can I expect after the procedure? °After your procedure, it is common to have a small amount of clear fluid coming from the puncture site. °Follow these instructions at home: °· Return to your normal activities as told by your health care provider. Ask your health care provider what activities are safe for you. °· Take over-the-counter and prescription medicines only as told by your health care provider. °· Do not take baths, swim, or use a hot tub until your health care provider approves. °· Follow instructions from your health care provider about: °? How to take care of your puncture site. °? When and how you should change your bandage (dressing). °? When you should remove your dressing. °· Check your puncture area every day signs of infection. Watch for: °? Redness, swelling, or pain. °? Fluid, blood, or pus. °· Keep all follow-up visits as told by your health care provider. This is important. °Contact a health care provider if: °· You have redness, swelling, or pain at your puncture site. °· You start to have more clear fluid coming from your puncture site. °· You have blood or pus coming from your puncture site. °· You have chills. °· You have a fever. °Get help right away if: °· You develop chest pain or shortness of breath. °· You develop increasing pain, discomfort, or swelling in your abdomen. °· You feel dizzy or light-headed or you pass out. °This information is not intended to replace advice given to you by your health care provider. Make sure you discuss any questions you  have with your health care provider. °Document Released: 10/19/2014 Document Revised: 11/10/2015 Document Reviewed: 08/17/2014 °Elsevier Interactive Patient Education © 2018 [employer] ° °

## 2016-12-24 ENCOUNTER — Telehealth: Payer: Self-pay

## 2016-12-24 ENCOUNTER — Other Ambulatory Visit: Payer: Self-pay | Admitting: Gastroenterology

## 2016-12-24 ENCOUNTER — Other Ambulatory Visit
Admission: RE | Admit: 2016-12-24 | Discharge: 2016-12-24 | Disposition: A | Discharge status: Home / Self Care | Payer: 59 | Source: Ambulatory Visit | Attending: Gastroenterology | Admitting: Gastroenterology

## 2016-12-24 ENCOUNTER — Ambulatory Visit
Admission: RE | Admit: 2016-12-24 | Discharge: 2016-12-24 | Disposition: A | Discharge status: Home / Self Care | Payer: 59 | Source: Ambulatory Visit | Attending: Gastroenterology | Admitting: Gastroenterology

## 2016-12-24 DIAGNOSIS — R188 Other ascites: Secondary | ICD-10-CM | POA: Insufficient documentation

## 2016-12-24 DIAGNOSIS — K7031 Alcoholic cirrhosis of liver with ascites: Secondary | ICD-10-CM | POA: Insufficient documentation

## 2016-12-24 DIAGNOSIS — K7460 Unspecified cirrhosis of liver: Secondary | ICD-10-CM | POA: Diagnosis present

## 2016-12-24 LAB — BASIC METABOLIC PANEL
ANION GAP: 7 (ref 5–15)
BUN: 32 mg/dL — ABNORMAL HIGH (ref 6–20)
CALCIUM: 9.1 mg/dL (ref 8.9–10.3)
CO2: 28 mmol/L (ref 22–32)
Chloride: 99 mmol/L — ABNORMAL LOW (ref 101–111)
Creatinine, Ser: 1.19 mg/dL (ref 0.61–1.24)
GFR calc Af Amer: >60 mL/min (ref >60)
GFR calc non Af Amer: >60 mL/min (ref >60)
GLUCOSE: 115 mg/dL — AB (ref 65–99)
Potassium: 4.8 mmol/L (ref 3.5–5.1)
Sodium: 134 mmol/L — ABNORMAL LOW (ref 135–145)

## 2016-12-24 NOTE — Telephone Encounter (Signed)
Advised spouse of lab due prior to paracentesis today.  Patient to arrive at 1230 and have lab drawn 1st then paracentesis.   Patient does have his new cell phone.

## 2016-12-25 ENCOUNTER — Other Ambulatory Visit: Payer: Self-pay

## 2016-12-25 DIAGNOSIS — K7031 Alcoholic cirrhosis of liver with ascites: Secondary | ICD-10-CM

## 2016-12-25 MED ORDER — NA SULFATE-K SULFATE-MG SULF 17.5-3.13-1.6 GM/177ML PO SOLN: 1.0000 | Freq: Once | ORAL | 0 refills | Status: AC

## 2016-12-26 ENCOUNTER — Encounter
Admission: RE | Disposition: A | Discharge status: Home / Self Care | Payer: Self-pay | Source: Ambulatory Visit | Attending: Gastroenterology

## 2016-12-26 ENCOUNTER — Ambulatory Visit: Payer: 59 | Admitting: Anesthesiology

## 2016-12-26 ENCOUNTER — Ambulatory Visit
Admission: RE | Admit: 2016-12-26 | Discharge: 2016-12-26 | Disposition: A | Discharge status: Home / Self Care | Payer: 59 | Source: Ambulatory Visit | Attending: Gastroenterology | Admitting: Gastroenterology

## 2016-12-26 DIAGNOSIS — D509 Iron deficiency anemia, unspecified: Secondary | ICD-10-CM | POA: Diagnosis present

## 2016-12-26 DIAGNOSIS — K2970 Gastritis, unspecified, without bleeding: Secondary | ICD-10-CM | POA: Diagnosis not present

## 2016-12-26 DIAGNOSIS — Z79899 Other long term (current) drug therapy: Secondary | ICD-10-CM | POA: Diagnosis not present

## 2016-12-26 DIAGNOSIS — K2960 Other gastritis without bleeding: Secondary | ICD-10-CM | POA: Insufficient documentation

## 2016-12-26 DIAGNOSIS — Z87891 Personal history of nicotine dependence: Secondary | ICD-10-CM | POA: Diagnosis not present

## 2016-12-26 DIAGNOSIS — I10 Essential (primary) hypertension: Secondary | ICD-10-CM | POA: Diagnosis not present

## 2016-12-26 DIAGNOSIS — K2950 Unspecified chronic gastritis without bleeding: Secondary | ICD-10-CM | POA: Insufficient documentation

## 2016-12-26 DIAGNOSIS — D123 Benign neoplasm of transverse colon: Secondary | ICD-10-CM | POA: Diagnosis not present

## 2016-12-26 DIAGNOSIS — K529 Noninfective gastroenteritis and colitis, unspecified: Secondary | ICD-10-CM | POA: Diagnosis not present

## 2016-12-26 DIAGNOSIS — K635 Polyp of colon: Secondary | ICD-10-CM | POA: Diagnosis not present

## 2016-12-26 DIAGNOSIS — D125 Benign neoplasm of sigmoid colon: Secondary | ICD-10-CM | POA: Diagnosis not present

## 2016-12-26 DIAGNOSIS — Z791 Long term (current) use of non-steroidal anti-inflammatories (NSAID): Secondary | ICD-10-CM | POA: Diagnosis not present

## 2016-12-26 DIAGNOSIS — K3189 Other diseases of stomach and duodenum: Secondary | ICD-10-CM | POA: Insufficient documentation

## 2016-12-26 HISTORY — PX: POLYPECTOMY: SHX5525

## 2016-12-26 HISTORY — DX: Inflammatory liver disease, unspecified: K75.9

## 2016-12-26 HISTORY — PX: ESOPHAGOGASTRODUODENOSCOPY (EGD) WITH PROPOFOL: SHX5813

## 2016-12-26 HISTORY — DX: Alcoholic cirrhosis of liver with ascites: K70.31

## 2016-12-26 HISTORY — DX: Unspecified osteoarthritis, unspecified site: M19.90

## 2016-12-26 HISTORY — PX: COLONOSCOPY WITH PROPOFOL: SHX5780

## 2016-12-26 SURGERY — ESOPHAGOGASTRODUODENOSCOPY (EGD) WITH PROPOFOL
Anesthesia: General | Wound class: Clean Contaminated

## 2016-12-26 MED ORDER — ACETAMINOPHEN 160 MG/5ML PO SOLN: 325.0000 mg | ORAL | Status: DC | PRN

## 2016-12-26 MED ORDER — STERILE WATER FOR IRRIGATION IR SOLN
Status: DC | PRN
  Administered 2016-12-26: 09:00:00

## 2016-12-26 MED ORDER — LIDOCAINE HCL (CARDIAC) 20 MG/ML IV SOLN
INTRAVENOUS | Status: DC | PRN
  Administered 2016-12-26: 40 mg via INTRAVENOUS

## 2016-12-26 MED ORDER — ACETAMINOPHEN 325 MG PO TABS: 325.0000 mg | ORAL_TABLET | ORAL | Status: DC | PRN

## 2016-12-26 MED ORDER — LACTATED RINGERS IV SOLN
INTRAVENOUS | Status: DC | PRN
  Administered 2016-12-26: 09:00:00 via INTRAVENOUS

## 2016-12-26 MED ORDER — LACTATED RINGERS IV SOLN: INTRAVENOUS | Status: DC

## 2016-12-26 MED ORDER — GLYCOPYRROLATE 0.2 MG/ML IJ SOLN
INTRAMUSCULAR | Status: DC | PRN
  Administered 2016-12-26: 0.2 mg via INTRAVENOUS

## 2016-12-26 MED ORDER — PROPOFOL 10 MG/ML IV BOLUS
INTRAVENOUS | Status: DC | PRN
  Administered 2016-12-26 (×8): 50 mg via INTRAVENOUS
  Administered 2016-12-26: 100 mg via INTRAVENOUS
  Administered 2016-12-26 (×2): 50 mg via INTRAVENOUS

## 2016-12-26 SURGICAL SUPPLY — 35 items
BALLN DILATOR 10-12 8 (BALLOONS)
BALLN DILATOR 12-15 8 (BALLOONS)
BALLN DILATOR 15-18 8 (BALLOONS)
BALLN DILATOR CRE 0-12 8 (BALLOONS)
BALLN DILATOR ESOPH 8 10 CRE (MISCELLANEOUS) IMPLANT
BALLOON DILATOR 12-15 8 (BALLOONS) IMPLANT
BALLOON DILATOR 15-18 8 (BALLOONS) IMPLANT
BALLOON DILATOR CRE 0-12 8 (BALLOONS) IMPLANT
BLOCK BITE 60FR ADLT L/F GRN (MISCELLANEOUS) ×4 IMPLANT
CANISTER SUCT 1200ML W/VALVE (MISCELLANEOUS) ×4 IMPLANT
CLIP HMST 235XBRD CATH ROT (MISCELLANEOUS) ×6 IMPLANT
CLIP RESOLUTION 360 11X235 (MISCELLANEOUS) ×6
FCP ESCP3.2XJMB 240X2.8X (MISCELLANEOUS)
FORCEPS BIOP RAD 4 LRG CAP 4 (CUTTING FORCEPS) ×4 IMPLANT
FORCEPS BIOP RJ4 240 W/NDL (MISCELLANEOUS)
FORCEPS ESCP3.2XJMB 240X2.8X (MISCELLANEOUS) IMPLANT
GOWN CVR UNV OPN BCK APRN NK (MISCELLANEOUS) ×4 IMPLANT
GOWN ISOL THUMB LOOP REG UNIV (MISCELLANEOUS) ×4
INJECTOR VARIJECT VIN23 (MISCELLANEOUS) ×2 IMPLANT
KIT DEFENDO VALVE AND CONN (KITS) IMPLANT
KIT ENDO PROCEDURE OLY (KITS) ×4 IMPLANT
MARKER SPOT ENDO TATTOO 5ML (MISCELLANEOUS) ×2 IMPLANT
PAD GROUND ADULT SPLIT (MISCELLANEOUS) IMPLANT
PROBE APC STR FIRE (PROBE) IMPLANT
RETRIEVER NET PLAT FOOD (MISCELLANEOUS) IMPLANT
RETRIEVER NET ROTH 2.5X230 LF (MISCELLANEOUS) IMPLANT
SNARE SHORT THROW 13M SML OVAL (MISCELLANEOUS) ×4 IMPLANT
SNARE SHORT THROW 30M LRG OVAL (MISCELLANEOUS) IMPLANT
SNARE SNG USE RND 15MM (INSTRUMENTS) IMPLANT
SPOT EX ENDOSCOPIC TATTOO (MISCELLANEOUS) ×2
SYR INFLATION 60ML (SYRINGE) IMPLANT
TRAP ETRAP POLY (MISCELLANEOUS) ×4 IMPLANT
VARIJECT INJECTOR VIN23 (MISCELLANEOUS) ×4
WATER STERILE IRR 250ML POUR (IV SOLUTION) ×4 IMPLANT
WIRE CRE 18-20MM 8CM F G (MISCELLANEOUS) IMPLANT

## 2016-12-26 NOTE — Op Note (Signed)
[facility] Patient Name: Shane Bradley Procedure Date: 12/26/2016 9:23 AM MRN: [id number] Account #: [id number] Date of Birth: 1951/05/27 Admit Type: Ambulatory Age: 66 Room: [facility] OR ROOM 01 Gender: Male Note Status: Finalized Procedure:            Colonoscopy Indications:          Iron deficiency anemia Providers:            Jonathon Bellows MD, MD Medicines:            Monitored Anesthesia Care Complications:        No immediate complications. Procedure:            Pre-Anesthesia Assessment:                       - Prior to the procedure, a History and Physical was                        performed, and patient medications, allergies and                        sensitivities were reviewed. The patient's tolerance of                        previous anesthesia was reviewed.                       - The risks and benefits of the procedure and the                        sedation options and risks were discussed with the                        patient. All questions were answered and informed                        consent was obtained.                       - ASA Grade Assessment: III - A patient with severe                        systemic disease.                       After obtaining informed consent, the colonoscope was                        passed under direct vision. Throughout the procedure,                        the patient's blood pressure, pulse, and oxygen                        saturations were monitored continuously. The [place] [phone]) was introduced through the                        anus and advanced to the the cecum, identified by the  appendiceal orifice, IC valve and transillumination.                        The colonoscopy was performed without difficulty. The                        patient tolerated the procedure well. Findings:      The perianal and digital rectal  examinations were normal.      A 15 mm polyp was found in the sigmoid colon. The polyp was       pedunculated. The polyp was removed with a hot snare. Resection and       retrieval were complete. To prevent bleeding after the polypectomy, two       hemostatic clips were successfully placed. There was no bleeding during,       or at the end, of the procedure. Area was tattooed with an injection of       5 mL of Spot (carbon black).      A 6 mm polyp was found in the colon. The polyp was sessile. The polyp       was removed with a cold snare. Resection and retrieval were complete. To       prevent bleeding after the polypectomy, two hemostatic clips were       successfully placed. There was no bleeding at the end of the procedure.      A 7 mm polyp was found in the transverse colon. The polyp was sessile.       The polyp was removed with a hot snare. Resection and retrieval were       complete.      A 10 mm polyp was found in the sigmoid colon. The polyp was sessile. The       polyp was removed with a hot snare. Resection and retrieval were       complete. To prevent bleeding after the polypectomy, two hemostatic       clips were successfully placed. There was no bleeding during, or at the       end, of the procedure.      The exam was otherwise without abnormality. Impression:           - One 15 mm polyp in the sigmoid colon, removed with a                        hot snare. Resected and retrieved. Clips were placed.                        Tattooed.                       - One 6 mm polyp, removed with a cold snare. Resected                        and retrieved. Clips were placed.                       - One 7 mm polyp in the transverse colon, removed with                        a hot snare. Resected and retrieved.                       -  One 10 mm polyp in the sigmoid colon, removed with a                        hot snare. Resected and retrieved. Clips were placed.                       -  The examination was otherwise normal. Recommendation:       - Discharge patient to home (with escort).                       - Resume previous diet.                       - Continue present medications.                       - Await pathology results.                       - Repeat colonoscopy for surveillance based on                        pathology results.                       - Return to my office as previously scheduled. Procedure Code(s):    --- Professional ---                       [phone], Colonoscopy, flexible; with removal of tumor(s),                        polyp(s), or other lesion(s) by snare technique                       [id number], Colonoscopy, flexible; with directed submucosal                        injection(s), any substance Diagnosis Code(s):    --- Professional ---                       D12.3, Benign neoplasm of transverse colon (hepatic                        flexure or splenic flexure)                       D12.5, Benign neoplasm of sigmoid colon                       D50.9, Iron deficiency anemia, unspecified CPT copyright 2016 American Medical Association. All rights reserved. The codes documented in this report are preliminary and upon coder review may  be revised to meet current compliance requirements. Jonathon Bellows, MD Jonathon Bellows MD, MD 12/26/2016 10:06:03 AM This report has been signed electronically. Number of Addenda: 0 Note Initiated On: 12/26/2016 9:23 AM Scope Withdrawal Time: 0 hours 18 minutes 10 seconds  Total Procedure Duration: 0 hours 31 minutes 26 seconds       [facility]

## 2016-12-26 NOTE — Anesthesia Procedure Notes (Signed)
Procedure Name: MAC Date/Time: 12/26/2016 9:11 AM Performed by: Janna Arch Pre-anesthesia Checklist: Patient identified, Emergency Drugs available, Suction available and Patient being monitored Patient Re-evaluated:Patient Re-evaluated prior to inductionOxygen Delivery Method: Nasal cannula

## 2016-12-26 NOTE — Op Note (Signed)
[facility] Patient Name: Shane Bradley Procedure Date: 12/26/2016 9:11 AM MRN: [id number] Account #: [id number] Date of Birth: 1951/05/23 Admit Type: Outpatient Age: 66 Room: [facility] OR ROOM 01 Gender: Male Note Status: Finalized Procedure:            Upper GI endoscopy Indications:          Iron deficiency anemia Providers:            Jonathon Bellows MD, MD Referring MD:         Mikeal Hawthorne. Brynda Greathouse MD, MD (Referring MD) Medicines:            Monitored Anesthesia Care Complications:        No immediate complications. Procedure:            Pre-Anesthesia Assessment:                       - Prior to the procedure, a History and Physical was                        performed, and patient medications, allergies and                        sensitivities were reviewed. The patient's tolerance of                        previous anesthesia was reviewed.                       - The risks and benefits of the procedure and the                        sedation options and risks were discussed with the                        patient. All questions were answered and informed                        consent was obtained.                       - ASA Grade Assessment: III - A patient with severe                        systemic disease.                       After obtaining informed consent, the endoscope was                        passed under direct vision. Throughout the procedure,                        the patient's blood pressure, pulse, and oxygen                        saturations were monitored continuously. The Olympus                        GIF H180J Endoscope (S#: [id number]) was introduced  through the mouth, and advanced to the third part of                        duodenum. The upper GI endoscopy was accomplished with                        ease. The patient tolerated the procedure well. Findings:      The examined duodenum was normal. Biopsies for  histology were taken with       a cold forceps for evaluation of celiac disease.      The esophagus was normal.      Patchy mild inflammation characterized by congestion (edema) and       erythema was found in the gastric antrum. Biopsies were taken with a       cold forceps for histology.      Localized moderately erythematous mucosa without bleeding was found on       the greater curvature of the gastric antrum. Biopsies were taken with a       cold forceps for histology.      The exam was otherwise without abnormality. Impression:           - Normal examined duodenum. Biopsied.                       - Normal esophagus.                       - Gastritis. Biopsied.                       - Erythematous mucosa in the greater curvature of the                        gastric antrum. Biopsied.                       - The examination was otherwise normal. Recommendation:       - Await pathology results.                       - Repeat upper endoscopy in 3 years for surveillance.                       - for esophageal varices                       - Perform a colonoscopy today. Procedure Code(s):    --- Professional ---                       [phone], Esophagogastroduodenoscopy, flexible, transoral;                        with biopsy, single or multiple Diagnosis Code(s):    --- Professional ---                       K31.89, Other diseases of stomach and duodenum                       K29.70, Gastritis, unspecified, without bleeding  D50.9, Iron deficiency anemia, unspecified CPT copyright 2016 American Medical Association. All rights reserved. The codes documented in this report are preliminary and upon coder review may  be revised to meet current compliance requirements. Jonathon Bellows, MD Jonathon Bellows MD, MD 12/26/2016 9:23:07 AM This report has been signed electronically. Number of Addenda: 0 Note Initiated On: 12/26/2016 9:11 AM Total Procedure Duration: 0 hours 4 minutes  39 seconds       [facility]

## 2016-12-26 NOTE — Transfer of Care (Signed)
Immediate Anesthesia Transfer of Care Note  Patient: Shane Bradley  Procedure(s) Performed: Procedure(s): ESOPHAGOGASTRODUODENOSCOPY (EGD) WITH PROPOFOL (N/A) COLONOSCOPY WITH PROPOFOL (N/A) POLYPECTOMY  Patient Location: PACU  Anesthesia Type: General  Level of Consciousness: awake, alert  and patient cooperative  Airway and Oxygen Therapy: Patient Spontanous Breathing and Patient connected to supplemental oxygen  Post-op Assessment: Post-op Vital signs reviewed, Patient's Cardiovascular Status Stable, Respiratory Function Stable, Patent Airway and No signs of Nausea or vomiting  Post-op Vital Signs: Reviewed and stable  Complications: No apparent anesthesia complications

## 2016-12-26 NOTE — Anesthesia Preprocedure Evaluation (Signed)
Anesthesia Evaluation  Patient identified by MRN, date of birth, ID band Patient awake    Reviewed: Allergy & Precautions, NPO status , Patient's Chart, lab work & pertinent test results  History of Anesthesia Complications Negative for: history of anesthetic complications  Airway Mallampati: I  TM Distance: >3 FB Neck ROM: Full    Dental no notable dental hx.    Pulmonary former smoker (quit 2002),    Pulmonary exam normal breath sounds clear to auscultation       Cardiovascular Exercise Tolerance: Good hypertension, Normal cardiovascular exam Rhythm:Regular Rate:Normal     Neuro/Psych negative neurological ROS  negative psych ROS   GI/Hepatic negative GI ROS, (+) Cirrhosis   ascites    , Hepatitis -, C  Endo/Other  negative endocrine ROS  Renal/GU negative Renal ROS     Musculoskeletal  (+) Arthritis , Osteoarthritis,    Abdominal   Peds  Hematology  (+) anemia ,   Anesthesia Other Findings   Reproductive/Obstetrics                             Anesthesia Physical Anesthesia Plan  ASA: III  Anesthesia Plan: General   Post-op Pain Management:    Induction: Intravenous  PONV Risk Score and Plan: 1 and Ondansetron and Propofol  Airway Management Planned: Natural Airway  Additional Equipment:   Intra-op Plan:   Post-operative Plan:   Informed Consent: I have reviewed the patients History and Physical, chart, labs and discussed the procedure including the risks, benefits and alternatives for the proposed anesthesia with the patient or authorized representative who has indicated his/her understanding and acceptance.     Plan Discussed with:   Anesthesia Plan Comments:         Anesthesia Quick Evaluation

## 2016-12-26 NOTE — Anesthesia Postprocedure Evaluation (Signed)
Anesthesia Post Note  Patient: Shane Bradley  Procedure(s) Performed: Procedure(s) (LRB): ESOPHAGOGASTRODUODENOSCOPY (EGD) WITH PROPOFOL (N/A) COLONOSCOPY WITH PROPOFOL (N/A) POLYPECTOMY  Patient location during evaluation: PACU Anesthesia Type: General Level of consciousness: awake and alert, oriented and patient cooperative Pain management: pain level controlled Vital Signs Assessment: post-procedure vital signs reviewed and stable Respiratory status: spontaneous breathing, nonlabored ventilation and respiratory function stable Cardiovascular status: blood pressure returned to baseline and stable Postop Assessment: adequate PO intake Anesthetic complications: no    Darrin Nipper

## 2016-12-26 NOTE — H&P (Signed)
Jonathon Bellows MD [address] [place], [place] [address] Phone: [phone] Fax : [phone]  Primary Care Physician:  Marden Noble, MD Primary Gastroenterologist:  Dr. Jonathon Bellows   Pre-Procedure History & Physical: HPI:  Shane Bradley is a 66 y.o. male is here for an endoscopy and colonoscopy.   Past Medical History:  Diagnosis Date  . Alcoholic cirrhosis of liver with ascites ([place])   . Arthritis    right foot  . Back complaints   . Hepatitis    "C" - New DX (12/11/16)  . Hypertension     Past Surgical History:  Procedure Laterality Date  . NO PAST SURGERIES      Prior to Admission medications   Medication Sig Start Date End Date Taking? Authorizing Provider  docusate sodium (COLACE) 100 MG capsule Take 1 capsule (100 mg total) by mouth 2 (two) times daily as needed for mild constipation. 11/30/16   Nicholes Mango, MD  furosemide (LASIX) 20 MG tablet Take 1 tablet (20 mg total) by mouth daily. 12/01/16   Gouru, Illene Silver, MD  hydrochlorothiazide (HYDRODIURIL) 25 MG tablet Take 25 mg by mouth daily. 10/11/16   [provider]  meloxicam (MOBIC) 15 MG tablet Take 15 mg by mouth daily. 11/15/16   [provider]  omeprazole (PRILOSEC) 20 MG capsule Take 1 capsule (20 mg total) by mouth daily. 11/30/16 11/30/17  Nicholes Mango, MD  oxyCODONE-acetaminophen (PERCOCET) 10-325 MG tablet Take 1 tablet by mouth every 6 (six) hours as needed. 11/15/16   [provider]  spironolactone (ALDACTONE) 50 MG tablet Take 1 tablet (50 mg total) by mouth daily. 12/01/16   Nicholes Mango, MD  tamsulosin (FLOMAX) 0.4 MG CAPS capsule Take 0.4 mg by mouth daily. 11/23/16   [provider]    Allergies as of 12/12/2016  . (No Known Allergies)    Family History  Problem Relation Age of Onset  . Kidney cancer Mother   . CAD Father     Social History   Social History  . Marital status: Married    Spouse name: N/A  . Number of children: N/A  . Years of  education: N/A   Occupational History  . Not on file.   Social History Main Topics  . Smoking status: Former Smoker    Types: Cigarettes    Quit date: 2002  . Smokeless tobacco: Never Used     Comment: only smoked for 2 yrs  . Alcohol use No  . Drug use: No  . Sexual activity: Not on file   Other Topics Concern  . Not on file   Social History Narrative  . No narrative on file    Review of Systems: See HPI, otherwise negative ROS  Physical Exam: Ht 5' 7.75" (1.721 m)   Wt 201 lb (91.2 kg)   BMI 30.79 kg/m  General:   Alert,  pleasant and cooperative in NAD Head:  Normocephalic and atraumatic. Neck:  Supple; no masses or thyromegaly. Lungs:  Clear throughout to auscultation.    Heart:  Regular rate and rhythm. Abdomen:  Soft, nontender and nondistended. Normal bowel sounds, without guarding, and without rebound.   Neurologic:  Alert and  oriented x4;  grossly normal neurologically.  Impression/Plan: Shane Bradley is here for an endoscopy and colonoscopy to be performed for iron deficiency anemia   Risks, benefits, limitations, and alternatives regarding  endoscopy and colonoscopy have been reviewed with the patient.  Questions have been answered.  All parties  agreeable.   Jonathon Bellows, MD  12/26/2016, 8:32 AM

## 2016-12-30 ENCOUNTER — Encounter: Payer: Self-pay | Admitting: Gastroenterology

## 2016-12-31 LAB — SURGICAL PATHOLOGY

## 2017-01-02 ENCOUNTER — Telehealth: Payer: Self-pay | Admitting: Gastroenterology

## 2017-01-02 NOTE — Telephone Encounter (Signed)
Patients wife, Tye [place], left a voice message for you to call her. [phone]

## 2017-01-02 NOTE — Telephone Encounter (Signed)
Returned phone call. Attempted to call spouse cell phone but circuits were busy.   LVM for callback and advised why unable to leave msg on cell phone or return call to [phone]

## 2017-01-03 ENCOUNTER — Telehealth: Payer: Self-pay

## 2017-01-03 ENCOUNTER — Other Ambulatory Visit: Payer: Self-pay

## 2017-01-03 DIAGNOSIS — K7031 Alcoholic cirrhosis of liver with ascites: Secondary | ICD-10-CM

## 2017-01-03 MED ORDER — SUCRALFATE 1 G PO TABS: 1.0000 g | ORAL_TABLET | Freq: Three times a day (TID) | ORAL | 2 refills | Status: AC

## 2017-01-03 NOTE — Telephone Encounter (Signed)
Advised spouse that per [facility] scheduling for Hep C treatment took 6 - 8 weeks to begin the process with review.   Patient will keep [facility] 8/23 Hep C treatment date.

## 2017-01-16 ENCOUNTER — Ambulatory Visit: Payer: Medicare Other | Admitting: Gastroenterology

## 2017-01-16 ENCOUNTER — Telehealth: Payer: Self-pay

## 2017-01-16 ENCOUNTER — Other Ambulatory Visit: Payer: Self-pay

## 2017-01-16 DIAGNOSIS — B182 Chronic viral hepatitis C: Secondary | ICD-10-CM

## 2017-01-16 NOTE — Telephone Encounter (Signed)
LVM for patient callback for results per Dr. Vicente Males.   1. Colon polyps pre cancerous- multiple- repeat in 3 years  2. Gastric intestinal metaplasia seen - repeat EGD in 6 months for gastric mapping   Remind patient about contact from [facility] for Hep C treatment.

## 2017-01-16 NOTE — Telephone Encounter (Signed)
-----   Message from Jonathon Bellows, MD sent at 01/13/2017  5:54 PM EDT ----- Dahlia Byes inform   1. Colon polyps pre cancerous- multiple- repeat in 3 years 2. Gastric intestinal metaplasia seen - repeat EGD in 6 months for gastric mapping

## 2017-02-11 DIAGNOSIS — B182 Chronic viral hepatitis C: Secondary | ICD-10-CM | POA: Insufficient documentation

## 2017-02-13 ENCOUNTER — Telehealth: Payer: Self-pay | Admitting: Gastroenterology

## 2017-02-13 NOTE — Telephone Encounter (Signed)
Patient would like to talk to you regarding an appt he had with a specialist and the mediations they put him on. Please call today.

## 2017-02-13 NOTE — Telephone Encounter (Signed)
Advised patient that [facility] may have put him back on the aldactone and lasix due to the possibility of excess fluid (ascites).  Per Dr. Vicente Males, advised to contact [facility] for lab results and ask the questions concerning ascites and the need for going back on the medications.

## 2017-06-19 DIAGNOSIS — K7460 Unspecified cirrhosis of liver: Secondary | ICD-10-CM | POA: Insufficient documentation

## 2017-06-19 DIAGNOSIS — K769 Liver disease, unspecified: Secondary | ICD-10-CM

## 2017-07-09 DIAGNOSIS — C220 Liver cell carcinoma: Secondary | ICD-10-CM | POA: Insufficient documentation

## 2017-07-09 DIAGNOSIS — M1990 Unspecified osteoarthritis, unspecified site: Secondary | ICD-10-CM | POA: Insufficient documentation

## 2017-07-16 DIAGNOSIS — Z006 Encounter for examination for normal comparison and control in clinical research program: Secondary | ICD-10-CM | POA: Insufficient documentation

## 2017-09-18 ENCOUNTER — Ambulatory Visit: Payer: Managed Care, Other (non HMO) | Attending: Nurse Practitioner | Admitting: Nurse Practitioner

## 2017-09-18 ENCOUNTER — Encounter: Payer: Self-pay | Admitting: Nurse Practitioner

## 2017-09-18 VITALS — BP 160/78 | HR 87 | Temp 98.6°F | Resp 16 | Ht 67.5 in | Wt 196.0 lb

## 2017-09-18 DIAGNOSIS — M899 Disorder of bone, unspecified: Secondary | ICD-10-CM | POA: Diagnosis not present

## 2017-09-18 DIAGNOSIS — Z515 Encounter for palliative care: Secondary | ICD-10-CM | POA: Insufficient documentation

## 2017-09-18 DIAGNOSIS — M542 Cervicalgia: Secondary | ICD-10-CM | POA: Diagnosis not present

## 2017-09-18 DIAGNOSIS — Z789 Other specified health status: Secondary | ICD-10-CM

## 2017-09-18 DIAGNOSIS — Z79891 Long term (current) use of opiate analgesic: Secondary | ICD-10-CM | POA: Diagnosis not present

## 2017-09-18 DIAGNOSIS — M25571 Pain in right ankle and joints of right foot: Secondary | ICD-10-CM | POA: Diagnosis not present

## 2017-09-18 DIAGNOSIS — Z87891 Personal history of nicotine dependence: Secondary | ICD-10-CM | POA: Insufficient documentation

## 2017-09-18 DIAGNOSIS — M79641 Pain in right hand: Secondary | ICD-10-CM | POA: Insufficient documentation

## 2017-09-18 DIAGNOSIS — M25561 Pain in right knee: Secondary | ICD-10-CM | POA: Diagnosis not present

## 2017-09-18 DIAGNOSIS — K7460 Unspecified cirrhosis of liver: Secondary | ICD-10-CM | POA: Insufficient documentation

## 2017-09-18 DIAGNOSIS — G8929 Other chronic pain: Secondary | ICD-10-CM | POA: Diagnosis not present

## 2017-09-18 DIAGNOSIS — D649 Anemia, unspecified: Secondary | ICD-10-CM | POA: Diagnosis not present

## 2017-09-18 DIAGNOSIS — M545 Low back pain: Secondary | ICD-10-CM | POA: Diagnosis not present

## 2017-09-18 DIAGNOSIS — B182 Chronic viral hepatitis C: Secondary | ICD-10-CM | POA: Insufficient documentation

## 2017-09-18 DIAGNOSIS — G894 Chronic pain syndrome: Secondary | ICD-10-CM | POA: Insufficient documentation

## 2017-09-18 DIAGNOSIS — Z79899 Other long term (current) drug therapy: Secondary | ICD-10-CM

## 2017-09-18 NOTE — Patient Instructions (Signed)

## 2017-09-18 NOTE — Progress Notes (Signed)
Safety precautions to be maintained throughout the outpatient stay will include: orient to surroundings, keep bed in low position, maintain call bell within reach at all times, provide assistance with transfer out of bed and ambulation.   Dx with liver cancer approx 1 year ago.  Being treated at [facility].  Patient very concerned with cost of labs and x-rays.

## 2017-09-18 NOTE — Progress Notes (Signed)
Patient's Name: Shane Bradley  MRN: [id number]  Referring Provider: Leslie Andrea, MD  DOB: 03-17-1951  PCP: Marden Noble, MD  DOS: 09/18/2017  Note by: Dionisio David NP  Service setting: Ambulatory outpatient  Specialty: Interventional Pain Management  Location: [facility] (AMB) Pain Management Facility    Patient type: New Patient    Primary Reason(s) for Visit: Initial Patient Evaluation CC: Back Pain (lower bilateral ); Knee Pain (right); Foot Pain (right); Hand Pain (right ); and Neck Pain (right)  HPI  Mr. Shane Bradley is a 67 y.o. year old, male patient, who comes today for an initial evaluation. He has Anasarca; Symptomatic anemia; Arthritis; Chronic hepatitis C without hepatic coma ([place]); Chronic liver disease and cirrhosis ([place]); Clinical trial participant; [place] (hepatocellular carcinoma) ([place]); Chronic bilateral low back pain without sciatica; Long term current use of opiate analgesic; Pharmacologic therapy; Disorder of skeletal system; Problems influencing health status; and Chronic pain syndrome on their problem list.. His primarily concern today is the Back Pain (lower bilateral ); Knee Pain (right); Foot Pain (right); Hand Pain (right ); and Neck Pain (right)  Pain Assessment: Location: Left, Lower, Right Back(see visit info for additional pain sites. ) Radiating: neck pain into right hand causing numbness and weakness  Onset: More than a month ago Duration: Chronic pain Quality: Discomfort, Constant, Aching Severity: 5 /10 (self-reported pain score)  Note: Reported level is compatible with observation.                          Effect on ADL: unable to move in the morning until he takes pain medication Timing: Constant Modifying factors: pain medications, heating pad, TENS unit  Onset and Duration: Present longer than 3 months Cause of pain: Work related accident or event Severity: NAS-11 at its worse: 10/10 Timing: Morning, Afternoon and During activity or exercise Aggravating Factors:  Prolonged sitting, Stooping , Walking and Working Alleviating Factors: Medications and TENS Associated Problems: Numbness Quality of Pain: Aching and Disabling Previous Examinations or Tests: MRI scan Previous Treatments: Chiropractic manipulations, Steroid treatments by mouth and TENS  The patient comes into the clinics today for the first time for a chronic pain management evaluation. According to the patient's primary area of pain is in his lower back. He admits this is from fall approximately 10 years ago. He states that he fell off a roof and landed on the shovel. He admits that the left side is greater than the right. He denies any pain going down his leg.He denies any previous surgery or interventional therapy He admits that he seen by chiropractor occasionally which is somewhat effective.  He admits that he does have right knee pain. He admits that this pain is not of concern and he does not want to add this to his treatment. He denies any previous surgery, interventional therapyor physical therapy or recent images.  Today I took the time to provide the patient with information regarding this pain practice. The patient was informed that the practice is divided into two sections: an interventional pain management section, as well as a completely separate and distinct medication management section. I explained that there are procedure days for interventional therapies, and evaluation days for follow-ups and medication management. Because of the amount of documentation required during both, they are kept separated. This means that there is the possibility that he may be scheduled for a procedure on one day, and medication management the next. I have also informed him that  because of staffing and facility limitations, this practice will no longer take patients for medication management only. To illustrate the reasons for this, I gave the patient the example of surgeons, and how inappropriate it would  be to refer a patient to his/her care, just to write for the post-surgical antibiotics on a surgery done by a different surgeon.   Because interventional pain management is part of the board-certified specialty for the doctors, the patient was informed that joining this practice means that they are open to any and all interventional therapies. I made it clear that this does not mean that they will be forced to have any procedures done. What this means is that I believe interventional therapies to be essential part of the diagnosis and proper management of chronic pain conditions. Therefore, patients not interested in these interventional alternatives will be better served under the care of a different practitioner.  The patient was also made aware of my Comprehensive Pain Management Safety Guidelines where by joining this practice, they limit all of their nerve blocks and joint injections to those done by our practice, for as long as we are retained to manage their care. Historic Controlled Substance Pharmacotherapy Review  PMP and historical list of controlled substances: oxycodone/acetaminophen 10/325 mg,tramadol 50 mg, hydrocodone/acetaminophen 10/325 mg, oxycodone/acetaminophen 7.5/325 mg, hydrocodone/acetaminophen 10/650 mg, hydrocodone/acetaminophen 10/500 mg, Highest opioid analgesic regimen found: oxycodone/acetaminophen 10/325 mg 4 times daily (last fill date 08/15/2017) oxycodone 40 mg per day Most recent opioid analgesic: oxycodone/acetaminophen 10/325 mg 4 times daily (last fill date 08/15/2017) oxycodone 40 mg per day Current opioid analgesics:oxycodone/acetaminophen 10/325 mg 4 times daily (last fill date 08/15/2017) oxycodone 40 mg per day Highest recorded MME/day: 34m/day MME/day: 60 mg/day Medications: The patient did not bring the medication(s) to the appointment, as requested in our "New Patient Package" Pharmacodynamics: Desired effects: Analgesia: The patient reports >50%  benefit. Reported improvement in function: The patient reports medication allows him to accomplish basic ADLs. Clinically meaningful improvement in function (CMIF): Sustained CMIF goals met Perceived effectiveness: Described as relatively effective, allowing for increase in activities of daily living (ADL) Undesirable effects: Side-effects or Adverse reactions: None reported Historical Monitoring: The patient  reports that he does not use drugs. List of all UDS Test(s): No results found for: MDMA, COCAINSCRNUR, PCPSCRNUR, PCPQUANT, CANNABQUANT, THCU, ELake AlfredList of all Serum Drug Screening Test(s):  No results found for: AMPHSCRSER, BARBSCRSER, BENZOSCRSER, COCAINSCRSER, PCPSCRSER, PCPQUANT, THCSCRSER, CANNABQUANT, OPIATESCRSER, OXYSCRSER, PROPOXSCRSER Historical Background Evaluation: [place] PDMP: Six (6) year initial data search conducted.              Department of public safety, offender search: (Editor, commissioningInformation) Non-contributory Risk Assessment Profile: Aberrant behavior: None observed or detected today Risk factors for fatal opioid overdose: None identified today Fatal overdose hazard ratio (HR): Calculation deferred Non-fatal overdose hazard ratio (HR): Calculation deferred Risk of opioid abuse or dependence: 0.7-3.0% with doses ? 36 MME/day and 6.1-26% with doses ? 120 MME/day. Substance use disorder (SUD) risk level: Pending results of Medical Psychology Evaluation for SUD Opioid risk tool (ORT) (Total Score):    ORT Scoring interpretation table:  Score <3 = Low Risk for SUD  Score between 4-7 = Moderate Risk for SUD  Score >8 = High Risk for Opioid Abuse   PHQ-2 Depression Scale:  Total score: 2  PHQ-2 Scoring interpretation table: (Score and probability of major depressive disorder)  Score 0 = No depression  Score 1 = 15.4% Probability  Score 2 = 21.1% Probability  Score  3 = 38.4% Probability  Score 4 = 45.5% Probability  Score 5 = 56.4% Probability  Score 6 = 78.6%  Probability   PHQ-9 Depression Scale:  Total score: 5  PHQ-9 Scoring interpretation table:  Score 0-4 = No depression  Score 5-9 = Mild depression  Score 10-14 = Moderate depression  Score 15-19 = Moderately severe depression  Score 20-27 = Severe depression (2.4 times higher risk of SUD and 2.89 times higher risk of overuse)   Pharmacologic Plan: Pending ordered tests and/or consults  Meds  The patient has a current medication list which includes the following prescription(s): docusate sodium, docusate sodium, furosemide, meloxicam, omeprazole, oxycodone-acetaminophen, spironolactone, tamsulosin, diphenhydramine, ferrous sulfate, melatonin, and sucralfate, and the following Facility-Administered Medications: albumin human.  Current Outpatient Medications on File Prior to Visit  Medication Sig  . docusate sodium (COLACE) 100 MG capsule Take 1 capsule (100 mg total) by mouth 2 (two) times daily as needed for mild constipation.  . docusate sodium (COLACE) 100 MG capsule Take 100 mg by mouth daily.  . furosemide (LASIX) 20 MG tablet Take 1 tablet (20 mg total) by mouth daily. (Patient taking differently: Take 40 mg by mouth daily. )  . meloxicam (MOBIC) 15 MG tablet Take 15 mg by mouth daily.  Marland Kitchen omeprazole (PRILOSEC) 20 MG capsule Take 1 capsule (20 mg total) by mouth daily.  Marland Kitchen oxyCODONE-acetaminophen (PERCOCET) 10-325 MG tablet Take 1 tablet by mouth every 6 (six) hours as needed.  Marland Kitchen spironolactone (ALDACTONE) 50 MG tablet Take 1 tablet (50 mg total) by mouth daily.  . tamsulosin (FLOMAX) 0.4 MG CAPS capsule Take 0.4 mg by mouth daily.  . diphenhydrAMINE (BENADRYL) 25 mg capsule Take 25 mg by mouth as needed.  . ferrous sulfate 325 (65 FE) MG tablet Take 325 mg by mouth daily.  . Melatonin 5 MG CAPS Take 5 mg by mouth at bedtime.  . sucralfate (CARAFATE) 1 g tablet Take 1 tablet (1 g total) by mouth 4 (four) times daily -  with meals and at bedtime.   Current Facility-Administered  Medications on File Prior to Visit  Medication  . albumin human 25 % solution 50 g   Imaging Review   Note: Available results from prior imaging studies were reviewed.        ROS  Cardiovascular History: No reported cardiovascular signs or symptoms such as High blood pressure, coronary artery disease, abnormal heart rate or rhythm, heart attack, blood thinner therapy or heart weakness and/or failure Pulmonary or Respiratory History: No reported pulmonary signs or symptoms such as wheezing and difficulty taking a deep full breath (Asthma), difficulty blowing air out (Emphysema), coughing up mucus (Bronchitis), persistent dry cough, or temporary stoppage of breathing during sleep Neurological History: No reported neurological signs or symptoms such as seizures, abnormal skin sensations, urinary and/or fecal incontinence, being born with an abnormal open spine and/or a tethered spinal cord Review of Past Neurological Studies: No results found for this or any previous visit. Psychological-Psychiatric History: No reported psychological or psychiatric signs or symptoms such as difficulty sleeping, anxiety, depression, delusions or hallucinations (schizophrenial), mood swings (bipolar disorders) or suicidal ideations or attempts Gastrointestinal History: Inflamed liver (Hepatitis) Genitourinary History: No reported renal or genitourinary signs or symptoms such as difficulty voiding or producing urine, peeing blood, non-functioning kidney, kidney stones, difficulty emptying the bladder, difficulty controlling the flow of urine, or chronic kidney disease Hematological History: No reported hematological signs or symptoms such as prolonged bleeding, low or poor functioning platelets, bruising  or bleeding easily, hereditary bleeding problems, low energy levels due to low hemoglobin or being anemic Endocrine History: No reported endocrine signs or symptoms such as high or low blood sugar, rapid heart rate due to  high thyroid levels, obesity or weight gain due to slow thyroid or thyroid disease Rheumatologic History: No reported rheumatological signs and symptoms such as fatigue, joint pain, tenderness, swelling, redness, heat, stiffness, decreased range of motion, with or without associated rash Musculoskeletal History: Negative for myasthenia gravis, muscular dystrophy, multiple sclerosis or malignant hyperthermia Work History: Retired  Allergies  Mr. Senger has No Known Allergies.  Laboratory Chemistry  Inflammation Markers Lab Results  Component Value Date   CRP 3.9 (H) 11/28/2016   ESRSEDRATE 33 (H) 11/28/2016   (CRP: Acute Phase) (ESR: Chronic Phase) Renal Function Markers Lab Results  Component Value Date   BUN 32 (H) 12/24/2016   CREATININE 1.19 12/24/2016   GFRAA >60 12/24/2016   GFRNONAA >60 12/24/2016   Hepatic Function Markers Lab Results  Component Value Date   AST 79 (H) 12/12/2016   ALT 39 12/12/2016   ALBUMIN 3.2 (L) 12/12/2016   ALKPHOS 145 (H) 12/12/2016   HCVAB >11.0 (H) 11/28/2016   Electrolytes Lab Results  Component Value Date   NA 134 (L) 12/24/2016   K 4.8 12/24/2016   CL 99 (L) 12/24/2016   CALCIUM 9.1 12/24/2016   MG 1.8 11/30/2016   Neuropathy Markers Lab Results  Component Value Date   VITAMINB12 1,337 (H) 11/29/2016   Bone Pathology Markers Lab Results  Component Value Date   ALKPHOS 145 (H) 12/12/2016   CALCIUM 9.1 12/24/2016   Coagulation Parameters Lab Results  Component Value Date   INR 1.25 12/12/2016   LABPROT 15.8 (H) 12/12/2016   APTT 33 11/27/2016   PLT 234 12/12/2016   Cardiovascular Markers Lab Results  Component Value Date   BNP 104.0 (H) 11/27/2016   HGB 9.3 (L) 12/12/2016   HCT 28.7 (L) 12/12/2016   Note: Lab results reviewed.  PFSH  Drug: Mr. Schroeter  reports that he does not use drugs. Alcohol:  reports that he does not drink alcohol. Tobacco:  reports that he quit smoking about 17 years ago. His smoking use  included cigarettes. He has never used smokeless tobacco. Medical:  has a past medical history of Alcoholic cirrhosis of liver with ascites ([place]), Arthritis, Back complaints, Hepatitis, and Hypertension. Family: family history includes CAD in his father; Kidney cancer in his mother.  Past Surgical History:  Procedure Laterality Date  . COLONOSCOPY WITH PROPOFOL N/A 12/26/2016   Procedure: COLONOSCOPY WITH PROPOFOL;  Surgeon: Jonathon Bellows, MD;  Location: [place];  Service: Endoscopy;  Laterality: N/A;  . ESOPHAGOGASTRODUODENOSCOPY (EGD) WITH PROPOFOL N/A 12/26/2016   Procedure: ESOPHAGOGASTRODUODENOSCOPY (EGD) WITH PROPOFOL;  Surgeon: Jonathon Bellows, MD;  Location: [place];  Service: Endoscopy;  Laterality: N/A;  . NO PAST SURGERIES    . POLYPECTOMY  12/26/2016   Procedure: POLYPECTOMY;  Surgeon: Jonathon Bellows, MD;  Location: [place];  Service: Endoscopy;;   Active Ambulatory Problems    Diagnosis Date Noted  . Anasarca 11/27/2016  . Symptomatic anemia 11/27/2016  . Arthritis 07/09/2017  . Chronic hepatitis C without hepatic coma ([place]) 02/11/2017  . Chronic liver disease and cirrhosis ([place]) 06/19/2017  . Clinical trial participant 07/16/2017  . [place] (hepatocellular carcinoma) ([place]) 07/09/2017  . Chronic bilateral low back pain without sciatica 09/18/2017  . Long term current use of opiate analgesic 09/18/2017  . Pharmacologic  therapy 09/18/2017  . Disorder of skeletal system 09/18/2017  . Problems influencing health status 09/18/2017  . Chronic pain syndrome 09/18/2017   Resolved Ambulatory Problems    Diagnosis Date Noted  . No Resolved Ambulatory Problems   Past Medical History:  Diagnosis Date  . Alcoholic cirrhosis of liver with ascites ([place])   . Arthritis   . Back complaints   . Hepatitis   . Hypertension    Constitutional Exam  General appearance: Well nourished, well developed, and well hydrated. In no apparent acute distress Vitals:    09/18/17 0837  BP: (!) 160/78  Pulse: 87  Resp: 16  Temp: 98.6 F (37 C)  TempSrc: Oral  SpO2: 98%  Weight: 196 lb (88.9 kg)  Height: 5' 7.5" (1.715 m)   BMI Assessment: Estimated body mass index is 30.24 kg/m as calculated from the following:   Height as of this encounter: 5' 7.5" (1.715 m).   Weight as of this encounter: 196 lb (88.9 kg).  BMI interpretation table: BMI level Category Range association with higher incidence of chronic pain  <18 kg/m2 Underweight   18.5-24.9 kg/m2 Ideal body weight   25-29.9 kg/m2 Overweight Increased incidence by 20%  30-34.9 kg/m2 Obese (Class I) Increased incidence by 68%  35-39.9 kg/m2 Severe obesity (Class II) Increased incidence by 136%  >40 kg/m2 Extreme obesity (Class III) Increased incidence by 254%   BMI Readings from Last 4 Encounters:  09/18/17 30.24 kg/m  12/26/16 28.95 kg/m  12/12/16 30.79 kg/m  11/30/16 34.02 kg/m   Wt Readings from Last 4 Encounters:  09/18/17 196 lb (88.9 kg)  12/26/16 189 lb (85.7 kg)  12/12/16 201 lb (91.2 kg)  11/30/16 217 lb 3.2 oz (98.5 kg)  Psych/Mental status: Alert, oriented x 3 (person, place, & time)       Eyes: PERLA Respiratory: No evidence of acute respiratory distress   Lumbar Spine Exam  Inspection: No masses, redness, or swelling Alignment: slight curvature noted in the lumbar region Functional ROM: Unrestricted ROM      Stability: No instability detected Muscle strength & Tone: Functionally intact Sensory: Unimpaired Palpation: Non-tender       Provocative Tests: Lumbar Hyperextension and rotation test: Positive bilaterally for facet joint pain.bilateral leg raises positive greater than 45 Patrick's Maneuver: Negative                    Gait & Posture Assessment  Ambulation: Unassisted Gait: Relatively normal for age and body habitus Posture: WNL   Lower Extremity Exam    Side: Right lower extremity  Side: Left lower extremity  Inspection: No masses, redness, swelling,  or asymmetry. No contractures  Inspection: No masses, redness, swelling, or asymmetry. No contractures  Functional ROM: Unrestricted ROM          Functional ROM: Unrestricted ROM          Muscle strength & Tone: Able to Toe-walk & Heel-walk without problems  Muscle strength & Tone: Able to Toe-walk & Heel-walk without problems  Sensory: Unimpaired  Sensory: Unimpaired  Palpation: No palpable anomalies  Palpation: No palpable anomalies   Assessment  Primary Diagnosis & Pertinent Problem List: The primary encounter diagnosis was Chronic bilateral low back pain without sciatica. Diagnoses of Chronic pain syndrome, Long term current use of opiate analgesic, Pharmacologic therapy, Disorder of skeletal system, and Problems influencing health status were also pertinent to this visit.  Visit Diagnosis: 1. Chronic bilateral low back pain without sciatica   2. Chronic pain  syndrome   3. Long term current use of opiate analgesic   4. Pharmacologic therapy   5. Disorder of skeletal system   6. Problems influencing health status    Plan of Care  Initial treatment plan:  Please be advised that as per protocol, today's visit has been an evaluation only. We have not taken over the patient's controlled substance management.  Problem-specific plan: No problem-specific Assessment & Plan notes found for this encounter.  Ordered Lab-work, Procedure(s), Referral(s), & Consult(s): Orders Placed This Encounter  Procedures  . DG Lumbar Spine Complete W/Bend  . Compliance Drug Analysis, Ur  . Magnesium  . Vitamin B12  . Sedimentation rate  . 25-Hydroxyvitamin D Lcms D2+D3  . C-reactive protein   Pharmacotherapy: Medications ordered:  No orders of the defined types were placed in this encounter.  Medications administered during this visit: Jacquenette Shone. Firebaugh had no medications administered during this visit.   Pharmacotherapy under consideration:  Opioid Analgesics: The patient was informed that there  is no guarantee that he would be a candidate for opioid analgesics. The decision will be made following CDC guidelines. This decision will be based on the results of diagnostic studies, as well as Mr. Rueth risk profile.  Membrane stabilizer: To be determined at a later time Muscle relaxant: To be determined at a later time NSAID: To be determined at a later time Other analgesic(s): To be determined at a later time   Interventional therapies under consideration: Mr. Wacha was informed that there is no guarantee that he would be a candidate for interventional therapies. The decision will be based on the results of diagnostic studies, as well as Mr. Kassim risk profile.  Possible procedure(s): Diagnostic bilateral lumbar facet nerve block Possible bilateral lumbar facet RFA   Provider-requested follow-up: Return for 2nd Visit, w/ Dr. Dossie Arbour, after [facility] eval.  No future appointments.  Primary Care Physician: Marden Noble, MD Location: [facility] Outpatient Pain Management Facility Note by:  Date: 09/18/2017; Time: 11:38 AM  Pain Score Disclaimer: We use the NRS-11 scale. This is a self-reported, subjective measurement of pain severity with only modest accuracy. It is used primarily to identify changes within a particular patient. It must be understood that outpatient pain scales are significantly less accurate that those used for research, where they can be applied under ideal controlled circumstances with minimal exposure to variables. In reality, the score is likely to be a combination of pain intensity and pain affect, where pain affect describes the degree of emotional arousal or changes in action readiness caused by the sensory experience of pain. Factors such as social and work situation, setting, emotional state, anxiety levels, expectation, and prior pain experience may influence pain perception and show large inter-individual differences that may also be affected by time  variables.  Patient instructions provided during this appointment: Patient Instructions   ____________________________________________________________________________________________  Appointment Policy Summary  It is our goal and responsibility to provide the medical community with assistance in the evaluation and management of patients with chronic pain. Unfortunately our resources are limited. Because we do not have an unlimited amount of time, or available appointments, we are required to closely monitor and manage their use. The following rules exist to maximize their use:  Patient's responsibilities: 1. Punctuality:  At what time should I arrive? You should be physically present in our office 30 minutes before your scheduled appointment. Your scheduled appointment is with your assigned healthcare provider. However, it takes 5-10 minutes to be "checked-in", and another  15 minutes for the nurses to do the admission. If you arrive to our office at the time you were given for your appointment, you will end up being at least 20-25 minutes late to your appointment with the provider. 2. Tardiness:  What happens if I arrive only a few minutes after my scheduled appointment time? You will need to reschedule your appointment. The cutoff is your appointment time. This is why it is so important that you arrive at least 30 minutes before that appointment. If you have an appointment scheduled for 10:00 AM and you arrive at 10:01, you will be required to reschedule your appointment.  3. Plan ahead:  Always assume that you will encounter traffic on your way in. Plan for it. If you are dependent on a driver, make sure they understand these rules and the need to arrive early. 4. Other appointments and responsibilities:  Avoid scheduling any other appointments before or after your pain clinic appointments.  5. Be prepared:  Write down everything that you need to discuss with your healthcare provider and give  this information to the admitting nurse. Write down the medications that you will need refilled. Bring your pills and bottles (even the empty ones), to all of your appointments, except for those where a procedure is scheduled. 6. No children or pets:  Find someone to take care of them. It is not appropriate to bring them in. 7. Scheduling changes:  We request "advanced notification" of any changes or cancellations. 8. Advanced notification:  Defined as a time period of more than 24 hours prior to the originally scheduled appointment. This allows for the appointment to be offered to other patients. 9. Rescheduling:  When a visit is rescheduled, it will require the cancellation of the original appointment. For this reason they both fall within the category of "Cancellations".  10. Cancellations:  They require advanced notification. Any cancellation less than 24 hours before the  appointment will be recorded as a "No Show". 11. No Show:  Defined as an unkept appointment where the patient failed to notify or declare to the practice their intention or inability to keep the appointment.  Corrective process for repeat offenders:  1. Tardiness: Three (3) episodes of rescheduling due to late arrivals will be recorded as one (1) "No Show". 2. Cancellation or reschedule: Three (3) cancellations or rescheduling will be recorded as one (1) "No Show". 3. "No Shows": Three (3) "No Shows" within a 12 month period will result in discharge from the practice. ____________________________________________________________________________________________  ____________________________________________________________________________________________  Pain Scale  Introduction: The pain score used by this practice is the Verbal Numerical Rating Scale (VNRS-11). This is an 11-point scale. It is for adults and children 10 years or older. There are significant differences in how the pain score is reported, used, and  applied. Forget everything you learned in the past and learn this scoring system.  General Information: The scale should reflect your current level of pain. Unless you are specifically asked for the level of your worst pain, or your average pain. If you are asked for one of these two, then it should be understood that it is over the past 24 hours.  Basic Activities of Daily Living (ADL): Personal hygiene, dressing, eating, transferring, and using restroom.  Instructions: Most patients tend to report their level of pain as a combination of two factors, their physical pain and their psychosocial pain. This last one is also known as "suffering" and it is reflection of how physical pain affects  you socially and psychologically. From now on, report them separately. From this point on, when asked to report your pain level, report only your physical pain. Use the following table for reference.  Pain Clinic Pain Levels (0-5/10)  Pain Level Score  Description  No Pain 0   Mild pain 1 Nagging, annoying, but does not interfere with basic activities of daily living (ADL). Patients are able to eat, bathe, get dressed, toileting (being able to get on and off the toilet and perform personal hygiene functions), transfer (move in and out of bed or a chair without assistance), and maintain continence (able to control bladder and bowel functions). Blood pressure and heart rate are unaffected. A normal heart rate for a healthy adult ranges from 60 to 100 bpm (beats per minute).   Mild to moderate pain 2 Noticeable and distracting. Impossible to hide from other people. More frequent flare-ups. Still possible to adapt and function close to normal. It can be very annoying and may have occasional stronger flare-ups. With discipline, patients may get used to it and adapt.   Moderate pain 3 Interferes significantly with activities of daily living (ADL). It becomes difficult to feed, bathe, get dressed, get on and off the  toilet or to perform personal hygiene functions. Difficult to get in and out of bed or a chair without assistance. Very distracting. With effort, it can be ignored when deeply involved in activities.   Moderately severe pain 4 Impossible to ignore for more than a few minutes. With effort, patients may still be able to manage work or participate in some social activities. Very difficult to concentrate. Signs of autonomic nervous system discharge are evident: dilated pupils (mydriasis); mild sweating (diaphoresis); sleep interference. Heart rate becomes elevated (>115 bpm). Diastolic blood pressure (lower number) rises above 100 mmHg. Patients find relief in laying down and not moving.   Severe pain 5 Intense and extremely unpleasant. Associated with frowning face and frequent crying. Pain overwhelms the senses.  Ability to do any activity or maintain social relationships becomes significantly limited. Conversation becomes difficult. Pacing back and forth is common, as getting into a comfortable position is nearly impossible. Pain wakes you up from deep sleep. Physical signs will be obvious: pupillary dilation; increased sweating; goosebumps; brisk reflexes; cold, clammy hands and feet; nausea, vomiting or dry heaves; loss of appetite; significant sleep disturbance with inability to fall asleep or to remain asleep. When persistent, significant weight loss is observed due to the complete loss of appetite and sleep deprivation.  Blood pressure and heart rate becomes significantly elevated. Caution: If elevated blood pressure triggers a pounding headache associated with blurred vision, then the patient should immediately seek attention at an urgent or emergency care unit, as these may be signs of an impending stroke.    Emergency Department Pain Levels (6-10/10)  Emergency Room Pain 6 Severely limiting. Requires emergency care and should not be seen or managed at an outpatient pain management facility.  Communication becomes difficult and requires great effort. Assistance to reach the emergency department may be required. Facial flushing and profuse sweating along with potentially dangerous increases in heart rate and blood pressure will be evident.   Distressing pain 7 Self-care is very difficult. Assistance is required to transport, or use restroom. Assistance to reach the emergency department will be required. Tasks requiring coordination, such as bathing and getting dressed become very difficult.   Disabling pain 8 Self-care is no longer possible. At this level, pain is disabling. The individual is  unable to do even the most "basic" activities such as walking, eating, bathing, dressing, transferring to a bed, or toileting. Fine motor skills are lost. It is difficult to think clearly.   Incapacitating pain 9 Pain becomes incapacitating. Thought processing is no longer possible. Difficult to remember your own name. Control of movement and coordination are lost.   The worst pain imaginable 10 At this level, most patients pass out from pain. When this level is reached, collapse of the autonomic nervous system occurs, leading to a sudden drop in blood pressure and heart rate. This in turn results in a temporary and dramatic drop in blood flow to the brain, leading to a loss of consciousness. Fainting is one of the body's self defense mechanisms. Passing out puts the brain in a calmed state and causes it to shut down for a while, in order to begin the healing process.    Summary: 1. Refer to this scale when providing [place] with your pain level. 2. Be accurate and careful when reporting your pain level. This will help with your care. 3. Over-reporting your pain level will lead to loss of credibility. 4. Even a level of 1/10 means that there is pain and will be treated at our facility. 5. High, inaccurate reporting will be documented as "Symptom Exaggeration", leading to loss of credibility and suspicions  of possible secondary gains such as obtaining more narcotics, or wanting to appear disabled, for fraudulent reasons. 6. Only pain levels of 5 or below will be seen at our facility. 7. Pain levels of 6 and above will be sent to the Emergency Department and the appointment cancelled. ____________________________________________________________________________________________

## 2017-09-19 ENCOUNTER — Telehealth: Payer: Self-pay

## 2017-09-19 NOTE — Telephone Encounter (Signed)
Post procedure phone call.  Left message.  

## 2017-09-23 LAB — 25-HYDROXY VITAMIN D LCMS D2+D3
25-Hydroxy, Vitamin D-2: <1 ng/mL
25-Hydroxy, Vitamin D-3: 24 ng/mL

## 2017-09-23 LAB — SEDIMENTATION RATE: Sed Rate: 55 mm/hr — ABNORMAL HIGH (ref 0–30)

## 2017-09-23 LAB — MAGNESIUM: Magnesium: 1.9 mg/dL (ref 1.6–2.3)

## 2017-09-23 LAB — C-REACTIVE PROTEIN: CRP: 22.8 mg/L — AB (ref 0.0–4.9)

## 2017-09-23 LAB — VITAMIN B12: Vitamin B-12: 1126 pg/mL (ref 232–1245)

## 2017-09-23 LAB — 25-HYDROXYVITAMIN D LCMS D2+D3: 25-HYDROXY, VITAMIN D: 24 ng/mL — AB

## 2017-09-24 ENCOUNTER — Encounter: Payer: Self-pay | Admitting: Nurse Practitioner

## 2017-09-24 ENCOUNTER — Other Ambulatory Visit: Payer: Self-pay | Admitting: Nurse Practitioner

## 2017-09-24 DIAGNOSIS — R700 Elevated erythrocyte sedimentation rate: Secondary | ICD-10-CM | POA: Insufficient documentation

## 2017-09-24 DIAGNOSIS — E559 Vitamin D deficiency, unspecified: Secondary | ICD-10-CM | POA: Insufficient documentation

## 2017-09-24 DIAGNOSIS — R7982 Elevated C-reactive protein (CRP): Secondary | ICD-10-CM

## 2017-09-24 LAB — COMPLIANCE DRUG ANALYSIS, UR

## 2018-04-28 ENCOUNTER — Emergency Department: Payer: Managed Care, Other (non HMO)

## 2018-04-28 ENCOUNTER — Other Ambulatory Visit: Payer: Self-pay

## 2018-04-28 ENCOUNTER — Encounter: Payer: Self-pay | Admitting: Emergency Medicine

## 2018-04-28 ENCOUNTER — Inpatient Hospital Stay
Admission: EM | Admit: 2018-04-28 | Discharge: 2018-05-18 | DRG: 871 | Disposition: E | Payer: Managed Care, Other (non HMO) | Attending: Internal Medicine | Admitting: Internal Medicine

## 2018-04-28 DIAGNOSIS — R531 Weakness: Secondary | ICD-10-CM | POA: Diagnosis present

## 2018-04-28 DIAGNOSIS — C220 Liver cell carcinoma: Secondary | ICD-10-CM

## 2018-04-28 DIAGNOSIS — N179 Acute kidney failure, unspecified: Secondary | ICD-10-CM | POA: Diagnosis present

## 2018-04-28 DIAGNOSIS — B1920 Unspecified viral hepatitis C without hepatic coma: Secondary | ICD-10-CM | POA: Diagnosis present

## 2018-04-28 DIAGNOSIS — E871 Hypo-osmolality and hyponatremia: Secondary | ICD-10-CM

## 2018-04-28 DIAGNOSIS — Z7901 Long term (current) use of anticoagulants: Secondary | ICD-10-CM

## 2018-04-28 DIAGNOSIS — M19071 Primary osteoarthritis, right ankle and foot: Secondary | ICD-10-CM | POA: Diagnosis present

## 2018-04-28 DIAGNOSIS — K659 Peritonitis, unspecified: Secondary | ICD-10-CM

## 2018-04-28 DIAGNOSIS — E875 Hyperkalemia: Secondary | ICD-10-CM

## 2018-04-28 DIAGNOSIS — Z791 Long term (current) use of non-steroidal anti-inflammatories (NSAID): Secondary | ICD-10-CM

## 2018-04-28 DIAGNOSIS — Z87891 Personal history of nicotine dependence: Secondary | ICD-10-CM

## 2018-04-28 DIAGNOSIS — K7210 Chronic hepatic failure without coma: Secondary | ICD-10-CM | POA: Diagnosis present

## 2018-04-28 DIAGNOSIS — Z515 Encounter for palliative care: Secondary | ICD-10-CM

## 2018-04-28 DIAGNOSIS — Z8249 Family history of ischemic heart disease and other diseases of the circulatory system: Secondary | ICD-10-CM

## 2018-04-28 DIAGNOSIS — A419 Sepsis, unspecified organism: Principal | ICD-10-CM | POA: Diagnosis present

## 2018-04-28 DIAGNOSIS — R4182 Altered mental status, unspecified: Secondary | ICD-10-CM | POA: Diagnosis not present

## 2018-04-28 DIAGNOSIS — I10 Essential (primary) hypertension: Secondary | ICD-10-CM | POA: Diagnosis present

## 2018-04-28 DIAGNOSIS — Z6829 Body mass index (BMI) 29.0-29.9, adult: Secondary | ICD-10-CM

## 2018-04-28 DIAGNOSIS — Z8051 Family history of malignant neoplasm of kidney: Secondary | ICD-10-CM

## 2018-04-28 DIAGNOSIS — K7290 Hepatic failure, unspecified without coma: Secondary | ICD-10-CM | POA: Diagnosis present

## 2018-04-28 DIAGNOSIS — Z66 Do not resuscitate: Secondary | ICD-10-CM | POA: Diagnosis present

## 2018-04-28 DIAGNOSIS — R64 Cachexia: Secondary | ICD-10-CM | POA: Diagnosis present

## 2018-04-28 DIAGNOSIS — E7220 Disorder of urea cycle metabolism, unspecified: Secondary | ICD-10-CM

## 2018-04-28 DIAGNOSIS — K7031 Alcoholic cirrhosis of liver with ascites: Secondary | ICD-10-CM | POA: Diagnosis present

## 2018-04-28 DIAGNOSIS — E872 Acidosis: Secondary | ICD-10-CM | POA: Diagnosis present

## 2018-04-28 DIAGNOSIS — R627 Adult failure to thrive: Secondary | ICD-10-CM | POA: Diagnosis present

## 2018-04-28 DIAGNOSIS — R066 Hiccough: Secondary | ICD-10-CM

## 2018-04-28 HISTORY — DX: Malignant (primary) neoplasm, unspecified: C80.1

## 2018-04-28 LAB — AMMONIA: AMMONIA: 229 umol/L — AB (ref 9–35)

## 2018-04-28 LAB — COMPREHENSIVE METABOLIC PANEL
ALBUMIN: 2.2 g/dL — AB (ref 3.5–5.0)
ALT: 318 U/L — ABNORMAL HIGH (ref 0–44)
ANION GAP: 15 (ref 5–15)
AST: 254 U/L — AB (ref 15–41)
Alkaline Phosphatase: 720 U/L — ABNORMAL HIGH (ref 38–126)
BUN: 83 mg/dL — AB (ref 8–23)
CHLORIDE: 85 mmol/L — AB (ref 98–111)
CO2: 17 mmol/L — ABNORMAL LOW (ref 22–32)
Calcium: 9.4 mg/dL (ref 8.9–10.3)
Creatinine, Ser: 3.05 mg/dL — ABNORMAL HIGH (ref 0.61–1.24)
GFR calc Af Amer: 23 mL/min — ABNORMAL LOW (ref >60)
GFR calc non Af Amer: 20 mL/min — ABNORMAL LOW (ref >60)
GLUCOSE: 162 mg/dL — AB (ref 70–99)
POTASSIUM: 6.3 mmol/L — AB (ref 3.5–5.1)
Sodium: 117 mmol/L — CL (ref 135–145)
Total Bilirubin: 2.8 mg/dL — ABNORMAL HIGH (ref 0.3–1.2)
Total Protein: 7.7 g/dL (ref 6.5–8.1)

## 2018-04-28 LAB — CBC
HCT: 44.7 % (ref 39.0–52.0)
Hemoglobin: 15 g/dL (ref 13.0–17.0)
MCH: 30.1 pg (ref 26.0–34.0)
MCHC: 33.6 g/dL (ref 30.0–36.0)
MCV: 89.6 fL (ref 80.0–100.0)
PLATELETS: 277 10*3/uL (ref 150–400)
RBC: 4.99 MIL/uL (ref 4.22–5.81)
RDW: 15 % (ref 11.5–15.5)
WBC: 29.2 10*3/uL — AB (ref 4.0–10.5)
nRBC: 0 % (ref 0.0–0.2)

## 2018-04-28 LAB — LIPASE, BLOOD: LIPASE: 35 U/L (ref 11–51)

## 2018-04-28 LAB — LACTIC ACID, PLASMA: LACTIC ACID, VENOUS: 6.8 mmol/L — AB (ref 0.5–1.9)

## 2018-04-28 MED ORDER — SODIUM CHLORIDE 0.9 % IV SOLN
2.0000 g | INTRAVENOUS | Status: DC
  Administered 2018-04-28: 2 g via INTRAVENOUS
  Filled 2018-04-28: qty 20

## 2018-04-28 MED ORDER — SODIUM CHLORIDE 0.9 % IV SOLN
Freq: Once | INTRAVENOUS | Status: AC
  Administered 2018-04-28: 22:00:00 via INTRAVENOUS

## 2018-04-28 MED ORDER — NALOXONE HCL 2 MG/2ML IJ SOSY
1.0000 mg | PREFILLED_SYRINGE | Freq: Once | INTRAMUSCULAR | Status: AC
  Administered 2018-04-28: 1 mg via INTRAVENOUS
  Filled 2018-04-28: qty 2

## 2018-04-28 NOTE — ED Triage Notes (Signed)
Pt to ED via EMS from home c/o abd pain, altered mental status today.  Pt alert to voice, hx of stage 4 liver CA with drain in placed right abd.  Pt has stopped taking CA medications a couple months ago, supposed to follow up with hospice tomorrow.

## 2018-04-28 NOTE — ED Provider Notes (Signed)
[facility] Emergency Department Provider Note  Time seen: 9:52 PM  I have reviewed the triage vital signs and the nursing notes.   HISTORY  Chief Complaint Abdominal Pain and Altered Mental Status    HPI Shane Bradley is a 67 y.o. male with a past medical history of stage IV liver cancer, cirrhosis, hypertension, presents to the emergency department for altered mental status.  According to family patient has end-stage liver disease, they were in the process of speaking to hospice, however they have not made arrangements yet as the patient was doing very well until the past several days and the patient has taken an acute decline.  Denies any known fever, no vomiting.  They believe the patient is currently taking all of his prescribed medications including lactulose.  Is prescribed pain medication but states he rarely uses it and do not believe he used any today.  Patient is very somnolent, will moan, response to pain, will occasionally respond verbally but very rarely.  Extremely confused and altered.   Past Medical History:  Diagnosis Date  . Alcoholic cirrhosis of liver with ascites ([place])   . Arthritis    right foot  . Back complaints   . Cancer [facility])    Stage 4 Liver Cancer  . Hepatitis    "C" - New DX (12/11/16)  . Hypertension     Patient Active Problem List   Diagnosis Date Noted  . Elevated C-reactive protein (CRP) 09/24/2017  . Elevated sed rate 09/24/2017  . Vitamin D insufficiency 09/24/2017  . Chronic bilateral low back pain without sciatica 09/18/2017  . Long term current use of opiate analgesic 09/18/2017  . Pharmacologic therapy 09/18/2017  . Disorder of skeletal system 09/18/2017  . Problems influencing health status 09/18/2017  . Chronic pain syndrome 09/18/2017  . Clinical trial participant 07/16/2017  . Arthritis 07/09/2017  . [place] (hepatocellular carcinoma) ([place]) 07/09/2017  . Chronic liver disease and cirrhosis ([place]) 06/19/2017  .  Chronic hepatitis C without hepatic coma ([place]) 02/11/2017  . Anasarca 11/27/2016  . Symptomatic anemia 11/27/2016    Past Surgical History:  Procedure Laterality Date  . COLONOSCOPY WITH PROPOFOL N/A 12/26/2016   Procedure: COLONOSCOPY WITH PROPOFOL;  Surgeon: Jonathon Bellows, MD;  Location: [place];  Service: Endoscopy;  Laterality: N/A;  . ESOPHAGOGASTRODUODENOSCOPY (EGD) WITH PROPOFOL N/A 12/26/2016   Procedure: ESOPHAGOGASTRODUODENOSCOPY (EGD) WITH PROPOFOL;  Surgeon: Jonathon Bellows, MD;  Location: [place];  Service: Endoscopy;  Laterality: N/A;  . NO PAST SURGERIES    . POLYPECTOMY  12/26/2016   Procedure: POLYPECTOMY;  Surgeon: Jonathon Bellows, MD;  Location: [place];  Service: Endoscopy;;    Prior to Admission medications   Medication Sig Start Date End Date Taking? Authorizing Provider  diphenhydrAMINE (BENADRYL) 25 mg capsule Take 25 mg by mouth as needed.    [provider]  docusate sodium (COLACE) 100 MG capsule Take 1 capsule (100 mg total) by mouth 2 (two) times daily as needed for mild constipation. 11/30/16   Gouru, Illene Silver, MD  docusate sodium (COLACE) 100 MG capsule Take 100 mg by mouth daily. 11/30/16   [provider]  ferrous sulfate 325 (65 FE) MG tablet Take 325 mg by mouth daily.    [provider]  furosemide (LASIX) 20 MG tablet Take 1 tablet (20 mg total) by mouth daily. Patient taking differently: Take 40 mg by mouth daily.  12/01/16   Nicholes Mango, MD  Melatonin 5 MG CAPS Take 5  mg by mouth at bedtime.    [provider]  meloxicam (MOBIC) 15 MG tablet Take 15 mg by mouth daily. 11/15/16   [provider]  omeprazole (PRILOSEC) 20 MG capsule Take 1 capsule (20 mg total) by mouth daily. 11/30/16 11/30/17  Nicholes Mango, MD  oxyCODONE-acetaminophen (PERCOCET) 10-325 MG tablet Take 1 tablet by mouth every 6 (six) hours as needed. 11/15/16   [provider]  spironolactone (ALDACTONE) 50 MG tablet  Take 1 tablet (50 mg total) by mouth daily. 12/01/16   Gouru, Illene Silver, MD  sucralfate (CARAFATE) 1 g tablet Take 1 tablet (1 g total) by mouth 4 (four) times daily -  with meals and at bedtime. 01/03/17 04/03/17  Jonathon Bellows, MD  tamsulosin (FLOMAX) 0.4 MG CAPS capsule Take 0.4 mg by mouth daily. 11/23/16   [provider]    No Known Allergies  Family History  Problem Relation Age of Onset  . Kidney cancer Mother   . CAD Father     Social History Social History   Tobacco Use  . Smoking status: Former Smoker    Types: Cigarettes    Last attempt to quit: 2002    Years since quitting: 17.8  . Smokeless tobacco: Never Used  . Tobacco comment: only smoked for 2 yrs  Substance Use Topics  . Alcohol use: No  . Drug use: No    Review of Systems Unable to obtain an adequate/accurate review of systems secondary to altered mental status  ____________________________________________   PHYSICAL EXAM:  VITAL SIGNS: ED Triage Vitals  Enc Vitals Group     BP 05/13/2018 2053 136/68     Pulse Rate 05/09/2018 2053 81     Resp 05/07/2018 2053 (!) 24     Temp 05/16/2018 2053 (!) 96.2 F (35.7 C)     Temp Source 05/10/2018 2053 Axillary     SpO2 04/25/2018 2053 96 %     Weight 04/23/2018 2057 194 lb 0.1 oz (88 kg)     Height --      Head Circumference --      Peak Flow --      Pain Score 04/25/2018 2056 5     Pain Loc --      Pain Edu? --      Excl. in [place]? --    Constitutional: Cachectic appearing with very large swollen abdomen, eyes are half shot, will moan at times, will respond to pain does not answer questions or follow commands.  Will occasionally speak 1 or 2 words to a family member Eyes: Normal exam, sunken in appearance. ENT   Head: Normocephalic and atraumatic   Mouth/Throat: Mucous membranes are moist. Cardiovascular: Normal rate, regular rhythm. Respiratory: Normal respiratory effort without tachypnea nor retractions. Breath sounds are clear  Gastrointestinal: Patient  has a distended abdomen however somewhat soft, consistent with ascitic fluid.  Pleurx drain in the right abdomen. Neurologic: Patient is altered, appears confused, moans, does appear to move all extremities at times. Skin:  Skin is warm, dry, but quite jaundiced in appearance. Psychiatric: Confused, altered.  ____________________________________________    EKG  EKG reviewed and interpreted by myself shows sinus arrhythmia at 77 bpm with a narrow QRS, normal axis, normal intervals, nonspecific ST changes.  ____________________________________________    RADIOLOGY  CT scan is negative Chest x-ray is negative likely atelectasis.  ____________________________________________   INITIAL IMPRESSION / ASSESSMENT AND PLAN / ED COURSE  Pertinent labs & imaging results that were available during my care of  the patient were reviewed by me and considered in my medical decision making (see chart for details).  Patient presents to the emergency department with altered mental status acutely over the past several days significantly worse today.  Patient is not responding to questions or following commands.  Has a swollen abdomen consistent with ascites, vitals show borderline low temperature 96.2 otherwise reassuring vitals.  Differential this time is quite broad but would include elevated ammonia, SBP, other infectious etiology, ICH as the patient is on apixaban with liver failure, electrolyte or metabolic abnormality, opioid use.  Patient's labs have begun to result showing a sodium of 117, will begin infusing IV fluids.  Potassium is 6.3.  Acute renal failure with creatinine of three-point now, all of the changes appear to be new for the patient.  Patient also has a white blood cell count of 29,000.  I used a Pleurx drain kit and drained 1 L of fluid from the patient's abdomen.  We will send down for evaluation of the ascitic fluid is extremely foul-smelling, cloudy, highly suspect SBP.  We will  start on broad-spectrum antibiotics cover with 2 g of IV Rocephin.  We will send blood cultures, lactic acid.  Patient will definitely require admission to the hospital once his work-up is completed.  Patient is chest x-ray and CT scan are largely nonrevealing.  Patient is extremely ill, largely nonresponsive at this time.  I had a difficult discussion with the patient's wife and family about this likely being a terminal event.  They are understanding.  We had a discussion regarding the patient's resuscitation status at this time they would like to hold off on resuscitative efforts as they do not want the patient to be on life support he do not want the patient to be intubated or have a breathing tube placed.  They have family members coming to see the patient from [place], and they would like to hold off on comfort care measures until family can arrive which I believe is very reasonable.  At this time we will continue with IV hydration continue with IV antibiotics and admit to the hospitalist service.  Family is aware of the patient's grim prognosis.  CRITICAL CARE Performed by: Harvest Dark   Total critical care time: 30 minutes  Critical care time was exclusive of separately billable procedures and treating other patients.  Critical care was necessary to treat or prevent imminent or life-threatening deterioration.  Critical care was time spent personally by me on the following activities: development of treatment plan with patient and/or surrogate as well as nursing, discussions with consultants, evaluation of patient's response to treatment, examination of patient, obtaining history from patient or surrogate, ordering and performing treatments and interventions, ordering and review of laboratory studies, ordering and review of radiographic studies, pulse oximetry and re-evaluation of patient's condition.   ____________________________________________   FINAL CLINICAL IMPRESSION(S) / ED  DIAGNOSES  Altered mental status Hyponatremia Hyperkalemia bacterial peritonitis   Harvest Dark, MD 04/21/2018 2322

## 2018-04-29 DIAGNOSIS — B1920 Unspecified viral hepatitis C without hepatic coma: Secondary | ICD-10-CM | POA: Diagnosis present

## 2018-04-29 DIAGNOSIS — K659 Peritonitis, unspecified: Secondary | ICD-10-CM | POA: Diagnosis present

## 2018-04-29 DIAGNOSIS — I10 Essential (primary) hypertension: Secondary | ICD-10-CM | POA: Diagnosis present

## 2018-04-29 DIAGNOSIS — A419 Sepsis, unspecified organism: Secondary | ICD-10-CM | POA: Diagnosis present

## 2018-04-29 DIAGNOSIS — Z8051 Family history of malignant neoplasm of kidney: Secondary | ICD-10-CM | POA: Diagnosis not present

## 2018-04-29 DIAGNOSIS — K7031 Alcoholic cirrhosis of liver with ascites: Secondary | ICD-10-CM | POA: Diagnosis present

## 2018-04-29 DIAGNOSIS — Z6829 Body mass index (BMI) 29.0-29.9, adult: Secondary | ICD-10-CM | POA: Diagnosis not present

## 2018-04-29 DIAGNOSIS — E875 Hyperkalemia: Secondary | ICD-10-CM | POA: Diagnosis present

## 2018-04-29 DIAGNOSIS — C220 Liver cell carcinoma: Secondary | ICD-10-CM | POA: Diagnosis present

## 2018-04-29 DIAGNOSIS — Z791 Long term (current) use of non-steroidal anti-inflammatories (NSAID): Secondary | ICD-10-CM | POA: Diagnosis not present

## 2018-04-29 DIAGNOSIS — K7210 Chronic hepatic failure without coma: Secondary | ICD-10-CM | POA: Diagnosis present

## 2018-04-29 DIAGNOSIS — K7290 Hepatic failure, unspecified without coma: Secondary | ICD-10-CM

## 2018-04-29 DIAGNOSIS — E7220 Disorder of urea cycle metabolism, unspecified: Secondary | ICD-10-CM | POA: Diagnosis not present

## 2018-04-29 DIAGNOSIS — E872 Acidosis: Secondary | ICD-10-CM | POA: Diagnosis present

## 2018-04-29 DIAGNOSIS — R64 Cachexia: Secondary | ICD-10-CM | POA: Diagnosis present

## 2018-04-29 DIAGNOSIS — R627 Adult failure to thrive: Secondary | ICD-10-CM | POA: Diagnosis present

## 2018-04-29 DIAGNOSIS — E871 Hypo-osmolality and hyponatremia: Secondary | ICD-10-CM

## 2018-04-29 DIAGNOSIS — M19071 Primary osteoarthritis, right ankle and foot: Secondary | ICD-10-CM | POA: Diagnosis present

## 2018-04-29 DIAGNOSIS — Z66 Do not resuscitate: Secondary | ICD-10-CM | POA: Diagnosis present

## 2018-04-29 DIAGNOSIS — Z8249 Family history of ischemic heart disease and other diseases of the circulatory system: Secondary | ICD-10-CM | POA: Diagnosis not present

## 2018-04-29 DIAGNOSIS — N179 Acute kidney failure, unspecified: Secondary | ICD-10-CM | POA: Diagnosis present

## 2018-04-29 DIAGNOSIS — R531 Weakness: Secondary | ICD-10-CM | POA: Diagnosis present

## 2018-04-29 DIAGNOSIS — Z87891 Personal history of nicotine dependence: Secondary | ICD-10-CM | POA: Diagnosis not present

## 2018-04-29 DIAGNOSIS — Z515 Encounter for palliative care: Secondary | ICD-10-CM

## 2018-04-29 DIAGNOSIS — Z7901 Long term (current) use of anticoagulants: Secondary | ICD-10-CM | POA: Diagnosis not present

## 2018-04-29 DIAGNOSIS — R066 Hiccough: Secondary | ICD-10-CM

## 2018-04-29 DIAGNOSIS — R4182 Altered mental status, unspecified: Secondary | ICD-10-CM | POA: Diagnosis present

## 2018-04-29 LAB — BODY FLUID CELL COUNT WITH DIFFERENTIAL
EOS FL: 0 %
Lymphs, Fluid: 33 %
Monocyte-Macrophage-Serous Fluid: 20 %
Neutrophil Count, Fluid: 47 %
Other Cells, Fluid: 0 %
Total Nucleated Cell Count, Fluid: 84 cu mm

## 2018-04-29 LAB — PATHOLOGIST SMEAR REVIEW

## 2018-04-29 MED ORDER — GLYCOPYRROLATE 1 MG PO TABS
1.0000 mg | ORAL_TABLET | ORAL | Status: DC | PRN
  Filled 2018-04-29 (×2): qty 1

## 2018-04-29 MED ORDER — POLYVINYL ALCOHOL 1.4 % OP SOLN
1.0000 [drp] | Freq: Four times a day (QID) | OPHTHALMIC | Status: DC | PRN
  Filled 2018-04-29: qty 15

## 2018-04-29 MED ORDER — DIPHENHYDRAMINE HCL 50 MG/ML IJ SOLN
12.5000 mg | INTRAMUSCULAR | Status: DC | PRN
  Filled 2018-04-29: qty 0.25

## 2018-04-29 MED ORDER — LORAZEPAM 1 MG PO TABS: 1.0000 mg | ORAL_TABLET | ORAL | Status: DC | PRN

## 2018-04-29 MED ORDER — MAGIC MOUTHWASH
7.5000 mL | Freq: Four times a day (QID) | ORAL | Status: DC | PRN
  Filled 2018-04-29: qty 10

## 2018-04-29 MED ORDER — IPRATROPIUM-ALBUTEROL 0.5-2.5 (3) MG/3ML IN SOLN: 3.0000 mL | Freq: Four times a day (QID) | RESPIRATORY_TRACT | Status: DC | PRN

## 2018-04-29 MED ORDER — LIDOCAINE VISCOUS HCL 2 % MT SOLN
7.5000 mL | Freq: Four times a day (QID) | OROMUCOSAL | Status: DC | PRN
  Filled 2018-04-29: qty 15

## 2018-04-29 MED ORDER — ONDANSETRON HCL 4 MG/2ML IJ SOLN: 4.0000 mg | Freq: Four times a day (QID) | INTRAMUSCULAR | Status: DC | PRN

## 2018-04-29 MED ORDER — MORPHINE SULFATE (PF) 2 MG/ML IV SOLN
1.0000 mg | INTRAVENOUS | Status: DC | PRN
  Administered 2018-04-29: 1 mg via INTRAVENOUS
  Filled 2018-04-29: qty 1

## 2018-04-29 MED ORDER — ONDANSETRON 4 MG PO TBDP
4.0000 mg | ORAL_TABLET | Freq: Four times a day (QID) | ORAL | Status: DC | PRN
  Filled 2018-04-29: qty 1

## 2018-04-29 MED ORDER — MORPHINE SULFATE (PF) 2 MG/ML IV SOLN
1.0000 mg | INTRAVENOUS | Status: DC | PRN
  Administered 2018-04-29: 2 mg via INTRAVENOUS
  Administered 2018-04-29: 1 mg via INTRAVENOUS
  Administered 2018-04-29 – 2018-04-30 (×5): 2 mg via INTRAVENOUS
  Filled 2018-04-29 (×6): qty 1

## 2018-04-29 MED ORDER — ACETAMINOPHEN 650 MG RE SUPP: 650.0000 mg | Freq: Four times a day (QID) | RECTAL | Status: DC | PRN

## 2018-04-29 MED ORDER — GLYCOPYRROLATE 0.2 MG/ML IJ SOLN
0.2000 mg | INTRAMUSCULAR | Status: DC | PRN
  Administered 2018-04-30 (×2): 0.2 mg via INTRAVENOUS
  Filled 2018-04-29 (×2): qty 1

## 2018-04-29 MED ORDER — SODIUM CHLORIDE 0.9 % IV SOLN
25.0000 mg | Freq: Four times a day (QID) | INTRAVENOUS | Status: DC | PRN
  Administered 2018-04-29: 25 mg via INTRAVENOUS
  Filled 2018-04-29: qty 1

## 2018-04-29 MED ORDER — BISACODYL 10 MG RE SUPP
10.0000 mg | Freq: Every day | RECTAL | Status: DC | PRN
  Filled 2018-04-29: qty 1

## 2018-04-29 MED ORDER — ALUM & MAG HYDROXIDE-SIMETH 200-200-20 MG/5ML PO SUSP: 15.0000 mL | ORAL | Status: DC | PRN

## 2018-04-29 MED ORDER — SENNA 8.6 MG PO TABS: 1.0000 | ORAL_TABLET | Freq: Every evening | ORAL | Status: DC | PRN

## 2018-04-29 MED ORDER — LORAZEPAM 2 MG/ML PO CONC: 1.0000 mg | ORAL | Status: DC | PRN

## 2018-04-29 MED ORDER — ACETAMINOPHEN 325 MG PO TABS: 650.0000 mg | ORAL_TABLET | Freq: Four times a day (QID) | ORAL | Status: DC | PRN

## 2018-04-29 MED ORDER — MAGIC MOUTHWASH W/LIDOCAINE: 15.0000 mL | Freq: Four times a day (QID) | ORAL | Status: DC | PRN

## 2018-04-29 MED ORDER — LORAZEPAM 2 MG/ML IJ SOLN
1.0000 mg | INTRAMUSCULAR | Status: DC | PRN
  Administered 2018-04-29 – 2018-04-30 (×4): 1 mg via INTRAVENOUS
  Filled 2018-04-29 (×4): qty 1

## 2018-04-29 MED ORDER — GLYCOPYRROLATE 0.2 MG/ML IJ SOLN
0.2000 mg | INTRAMUSCULAR | Status: DC | PRN
  Filled 2018-04-29: qty 1

## 2018-04-29 NOTE — Clinical Social Work Note (Signed)
Clinical Social Work Assessment  Patient Details  Name: Shane Bradley MRN: [id number] Date of Birth: 06/01/1951  Date of referral:  04/29/18               Reason for consult:  Discharge Planning, End of Life/Hospice                Permission sought to share information with:  Chartered certified accountant granted to share information::  Yes, Verbal Permission Granted  Name::        Agency::     Relationship::     Contact Information:     Housing/Transportation Living arrangements for the past 2 months:  Single Family Home Source of Information:  Adult Children, Spouse Patient Interpreter Needed:  None Criminal Activity/Legal Involvement Pertinent to Current Situation/Hospitalization:  No - Comment as needed Significant Relationships:  Adult Children, Spouse Lives with:  Spouse Do you feel safe going back to the place where you live?    Need for family participation in patient care:  Yes (Comment)  Care giving concerns:  Patient lives in [place] with his wife Shane Bradley [phone].    Social Worker assessment / plan:  Holiday representative (CSW) received hospice facility placement consult. CSW met with patient who was laying in the bed and did not participate in assessment. Patient's wife Shane Bradley and daughter Shane Bradley [phone] were at bedside. Patient had several other family members at bedside as well. CSW introduced self and explained role of CSW department. Wife reported that they have agreed on making patient comfortable and want him to be moved to the hospice facility in [place]. CSW provided emotional support. CSW made referral to [facility]. Per [facility] liaison she will meet with family today. Palliative care team is aware of above. CSW will continue to follow and assist as needed.    Employment status:  Disabled (Comment on whether or not currently receiving Disability) Insurance information:  Medicare, Managed Care PT  Recommendations:  Not assessed at this time Information / Referral to community resources:  Other (Comment Required)([facility] referral made. )  Patient/Family's Response to care:  Patient's wife requested [facility].   Patient/Family's Understanding of and Emotional Response to Diagnosis, Current Treatment, and Prognosis:  Patient's wife and his family understand patient's poor prognosis and want to make him comfortable in the hospice facility.   Emotional Assessment Appearance:  Appears stated age Attitude/Demeanor/Rapport:  Unable to Assess Affect (typically observed):  Unable to Assess Orientation:  Fluctuating Orientation (Suspected and/or reported Sundowners) Alcohol / Substance use:  Not Applicable Psych involvement (Current and /or in the community):  No (Comment)  Discharge Needs  Concerns to be addressed:  Discharge Planning Concerns Readmission within the last 30 days:  No Current discharge risk:  Dependent with Mobility, Cognitively Impaired, Chronically ill, Terminally ill Barriers to Discharge:  Continued Medical Work up   [employer], Veronia Beets, LCSW 04/29/2018, 3:10 PM

## 2018-04-29 NOTE — H&P (Signed)
[facility] [place] NAME: Shane Bradley    MR#:  [id number]  DATE OF BIRTH:  08-14-1950  DATE OF ADMISSION:  04/18/2018  PRIMARY CARE PHYSICIAN: Leslie Andrea, MD   REQUESTING/REFERRING PHYSICIAN:   CHIEF COMPLAINT:   Chief Complaint  Patient presents with  . Abdominal Pain  . Altered Mental Status    HISTORY OF PRESENT ILLNESS: Shane Bradley  is a 67 y.o. male with a known history of hepatitis C, liver cirrhosis, stage IV hepatocellular carcinoma, PE on chronic anticoagulation with Eliquis, hypertension and other comorbidities.  Patient is unable to provide history due to altered mental status.  Information was taken from reviewing the medical chart and from discussion with emergency room physician and the family. He was brought to emergency room for altered mental status and lethargy.  Per family, patient has been declining rapidly in the past week or so and he was recommended hospice per primary care physician and oncology specialist.  Due to increased drowsiness and generalized weakness, his p.o. intake has been very poor in the past week; he has not been able to take his medications either in the past few days. Blood test done emergency room are notable for sodium 117, potassium 6.3, WBC 29,000, lactic acid 6.8, creatinine 3.05. No acute intracranial abnormalities per CT scan of the brain. Chest x-ray shows bibasilar opacities, likely atelectasis versus pneumonia. Ascites fluid drained by Pleurx was noted to be foul-smelling, cloudy, highly suspicious for SBP. Patient is admitted for further evaluation and treatment.   PAST MEDICAL HISTORY:   Past Medical History:  Diagnosis Date  . Alcoholic cirrhosis of liver with ascites ([place])   . Arthritis    right foot  . Back complaints   . Cancer [facility])    Stage 4 Liver Cancer  . Hepatitis    "C" - New DX (12/11/16)  . Hypertension     PAST SURGICAL HISTORY:  Past Surgical History:   Procedure Laterality Date  . COLONOSCOPY WITH PROPOFOL N/A 12/26/2016   Procedure: COLONOSCOPY WITH PROPOFOL;  Surgeon: Jonathon Bellows, MD;  Location: [facility];  Service: Endoscopy;  Laterality: N/A;  . ESOPHAGOGASTRODUODENOSCOPY (EGD) WITH PROPOFOL N/A 12/26/2016   Procedure: ESOPHAGOGASTRODUODENOSCOPY (EGD) WITH PROPOFOL;  Surgeon: Jonathon Bellows, MD;  Location: [facility];  Service: Endoscopy;  Laterality: N/A;  . NO PAST SURGERIES    . POLYPECTOMY  12/26/2016   Procedure: POLYPECTOMY;  Surgeon: Jonathon Bellows, MD;  Location: [facility];  Service: Endoscopy;;    SOCIAL HISTORY:  Social History   Tobacco Use  . Smoking status: Former Smoker    Types: Cigarettes    Last attempt to quit: 2002    Years since quitting: 17.8  . Smokeless tobacco: Never Used  . Tobacco comment: only smoked for 2 yrs  Substance Use Topics  . Alcohol use: No    FAMILY HISTORY:  Family History  Problem Relation Age of Onset  . Kidney cancer Mother   . CAD Father     DRUG ALLERGIES: No Known Allergies  REVIEW OF SYSTEMS:   Unable to obtain due to patient's altered mental status.  MEDICATIONS AT HOME:  Prior to Admission medications   Medication Sig Start Date End Date Taking? Authorizing Provider  apixaban (ELIQUIS) 5 MG TABS tablet Take 1 tablet by mouth 2 (two) times daily. 04/07/18  Yes [provider]  Cetirizine HCl 10 MG CAPS Take 1 tablet by mouth daily.   Yes  [provider]  diphenhydrAMINE (BENADRYL) 25 mg capsule Take 25 mg by mouth as needed.   Yes [provider]  docusate sodium (COLACE) 100 MG capsule Take 1 capsule (100 mg total) by mouth 2 (two) times daily as needed for mild constipation. 11/30/16  Yes Gouru, Illene Silver, MD  furosemide (LASIX) 20 MG tablet Take 1 tablet (20 mg total) by mouth daily. 12/01/16  Yes Gouru, Illene Silver, MD  hydrOXYzine (ATARAX/VISTARIL) 10 MG tablet Take 1 tablet by mouth 3 (three) times daily as needed. 04/07/18   Yes [provider]  Melatonin 5 MG CAPS Take 5 mg by mouth at bedtime.   Yes [provider]  meloxicam (MOBIC) 15 MG tablet Take 15 mg by mouth daily. 11/15/16  Yes [provider]  omeprazole (PRILOSEC) 20 MG capsule Take 1 capsule (20 mg total) by mouth daily. Patient taking differently: Take 20 mg by mouth daily as needed.  11/30/16 05/17/2018 Yes Gouru, Illene Silver, MD  Oxycodone HCl 10 MG TABS Take 1 tablet by mouth every 4 (four) hours as needed. 04/07/18 05/07/18 Yes [provider]  potassium chloride SA (K-DUR,KLOR-CON) 20 MEQ tablet Take 1 tablet by mouth 2 (two) times daily. 04/07/18 04/07/19 Yes [provider]  pseudoephedrine (SUDAFED) 30 MG tablet Take 30 mg by mouth every 4 (four) hours as needed for congestion.   Yes [provider]  spironolactone (ALDACTONE) 50 MG tablet Take 1 tablet (50 mg total) by mouth daily. 12/01/16  Yes Gouru, Illene Silver, MD  tamsulosin (FLOMAX) 0.4 MG CAPS capsule Take 0.4 mg by mouth daily. 11/23/16  Yes [provider]  traZODone (DESYREL) 50 MG tablet Take 1 tablet by mouth at bedtime. 04/14/18 04/14/19 Yes [provider]  docusate sodium (COLACE) 100 MG capsule Take 100 mg by mouth daily. 11/30/16   [provider]  ferrous sulfate 325 (65 FE) MG tablet Take 325 mg by mouth daily.    [provider]  Lenvatinib 12 mg daily dose (LENVIMA) 3 x 4 MG capsule Take 3 capsules (12 mg dose) by mouth once daily. 02/05/18   [provider]  oxyCODONE-acetaminophen (PERCOCET) 10-325 MG tablet Take 1 tablet by mouth every 6 (six) hours as needed. 11/15/16   [provider]  sucralfate (CARAFATE) 1 g tablet Take 1 tablet (1 g total) by mouth 4 (four) times daily -  with meals and at bedtime. 01/03/17 04/03/17  Jonathon Bellows, MD      PHYSICAL EXAMINATION:   VITAL SIGNS: Blood pressure 136/73, pulse 92, temperature (!) 96.2 F (35.7 C), temperature source Axillary, resp. rate  18, weight 88 kg, SpO2 96 %.  GENERAL:  67 y.o.-year-old patient lying in the bed, jaundiced, chronically ill-appearing, very lethargic and confused. EYES: Pupils equal, round, reactive to light and accommodation.  Bilateral scleral icterus noted.  HEENT: Head atraumatic, normocephalic. Oropharynx and nasopharynx clear.  NECK:  Supple, no jugular venous distention. No thyroid enlargement, no tenderness.  LUNGS: Normal breath sounds bilaterally, no wheezing, rales,rhonchi or crepitation. No use of accessory muscles of respiration.  CARDIOVASCULAR: S1, S2 normal. No S3/S4.  ABDOMEN: Abdomen is diffusely tender and very distended, but soft.  Pleurx drain is noted in the right abdomen.  Bowel sounds present.  EXTREMITIES: 2-3+ bilateral lower extremities edema is noted.  NEUROLOGIC: Cranial nerves II through XII are intact. Muscle strength 5/5 in all extremities. Sensation intact.   PSYCHIATRIC: The patient is alert and oriented x 3.  SKIN: Jaundiced.  No obvious rash, lesion, or  ulcer.   LABORATORY PANEL:   CBC Recent Labs  Lab 05/01/2018 2103  WBC 29.2*  HGB 15.0  HCT 44.7  PLT 277  MCV 89.6  MCH 30.1  MCHC 33.6  RDW 15.0   ------------------------------------------------------------------------------------------------------------------  Chemistries  Recent Labs  Lab 04/21/2018 2103  NA 117*  K 6.3*  CL 85*  CO2 17*  GLUCOSE 162*  BUN 83*  CREATININE 3.05*  CALCIUM 9.4  AST 254*  ALT 318*  ALKPHOS 720*  BILITOT 2.8*   ------------------------------------------------------------------------------------------------------------------ CrCl cannot be calculated (Unknown ideal weight.). ------------------------------------------------------------------------------------------------------------------ No results for input(s): TSH, T4TOTAL, T3FREE, THYROIDAB in the last 72 hours.  Invalid input(s): FREET3   Coagulation profile No results for input(s): INR, PROTIME in the  last 168 hours. ------------------------------------------------------------------------------------------------------------------- No results for input(s): DDIMER in the last 72 hours. -------------------------------------------------------------------------------------------------------------------  Cardiac Enzymes No results for input(s): CKMB, TROPONINI, MYOGLOBIN in the last 168 hours.  Invalid input(s): CK ------------------------------------------------------------------------------------------------------------------ Invalid input(s): POCBNP  ---------------------------------------------------------------------------------------------------------------  Urinalysis    Component Value Date/Time   COLORURINE YELLOW (A) 11/29/2016 1300   APPEARANCEUR CLEAR (A) 11/29/2016 1300   LABSPEC 1.006 11/29/2016 1300   PHURINE 7.0 11/29/2016 1300   GLUCOSEU NEGATIVE 11/29/2016 1300   HGBUR NEGATIVE 11/29/2016 1300   BILIRUBINUR NEGATIVE 11/29/2016 1300   KETONESUR NEGATIVE 11/29/2016 1300   PROTEINUR NEGATIVE 11/29/2016 1300   NITRITE NEGATIVE 11/29/2016 1300   LEUKOCYTESUR NEGATIVE 11/29/2016 1300     RADIOLOGY: Ct Head Wo Contrast  Result Date: 05/15/2018 CLINICAL DATA:  Encephalopathy altered mental status history of liver cancer EXAM: CT HEAD WITHOUT CONTRAST TECHNIQUE: Contiguous axial images were obtained from the base of the skull through the vertex without intravenous contrast. COMPARISON:  None. FINDINGS: Brain: No acute territorial infarction, hemorrhage or intracranial mass. Moderate atrophy. Minimal small vessel ischemic change of the white matter. Ventricles felt secondary to atrophy. Vascular: No hyperdense vessels.  No unexpected calcification Skull: Normal. Negative for fracture or focal lesion. Sinuses/Orbits: Fluid level and mucosal disease in left maxillary sinus Other: None IMPRESSION: 1. CT evidence for acute intracranial. 2. Atrophy and minimal small vessel ischemic  changes of the white matter 3. Left maxillary sinusitis Electronically Signed   By: Donavan Foil M.D.   On: 05/08/2018 22:25   Dg Chest Portable 1 View  Result Date: 05/09/2018 CLINICAL DATA:  Acute mental status change EXAM: PORTABLE CHEST 1 VIEW COMPARISON:  November 28, 2016 FINDINGS: Skin folds are seen over the right apex without obvious pneumothorax. The cardiomediastinal silhouette is stable. Bibasilar opacities are somewhat platelike and may be atelectasis. No other acute abnormalities. Healed rib fractures on the left. IMPRESSION: 1. Bibasilar opacities may represent atelectasis. Pneumonia not completely excluded. 2. No other acute abnormalities noted. Skin folds over the right apex without definitive pneumothorax. Electronically Signed   By: Dorise Bullion III M.D   On: 05/15/2018 22:08    EKG: Orders placed or performed during the hospital encounter of 05/13/2018  . EKG 12-Lead  . EKG 12-Lead  . ED EKG  . ED EKG    IMPRESSION AND PLAN:  1.  Stage IV hepatocellular carcinoma 2.  Advanced liver cirrhosis 3.  Hepatitis C 4.  Massive ascites, secondary to liver disease 5.  Sepsis, likely secondary to bacterial peritonitis 6.  Hyponatremia 7.  Hyperkalemia 8.  Acute encephalopathy 9.  Acute renal failure 10.  Failure to thrive  Overall, long-term prognosis in this patient is very poor.  This was discussed in detail with patient's family. The decision was  made for comfort care only and DNR.  Palliative care team is consulted.  All the records are reviewed and case discussed with ED provider. Management plans discussed with the patient, family and they are in agreement.  CODE STATUS: DNR    Code Status Orders  (From admission, onward)         Start     Ordered   05/16/2018 2337  Do not attempt resuscitation/DNR  Continuous    Question Answer Comment  In the event of cardiac or respiratory ARREST Do not call a "code blue"   In the event of cardiac or respiratory ARREST Do  not perform Intubation, CPR, defibrillation or ACLS   In the event of cardiac or respiratory ARREST Use medication by any route, position, wound care, and other measures to relive pain and suffering. May use oxygen, suction and manual treatment of airway obstruction as needed for comfort.      05/16/2018 2336        Code Status History    Date Active Date Inactive Code Status Order ID Comments User Context   11/27/2016 1902 11/30/2016 1746 Full Code [id number]  Vaughan Basta, MD Inpatient       TOTAL TIME TAKING CARE OF THIS PATIENT: 60 minutes.    Amelia Jo M.D on 04/29/2018 at 2:17 AM  Between 7am to 6pm - Pager - [phone]  After 6pm go to [email or web address] - password [email or web address] [facility] [place] [facility]  [phone]  CC: Primary care physician; Leslie Andrea, MD

## 2018-04-29 NOTE — Progress Notes (Signed)
Daily Progress Note   Patient Name: Shane Bradley       Date: 04/29/2018 DOB: Aug 31, 1950  Age: 67 y.o. MRN#: [id number] Attending Physician: Sela Hua, MD Primary Care Physician: Leslie Andrea, MD Admit Date: 05/14/2018  Reason for Consultation/Follow-up: Establishing goals of care  Subjective: Patient unresponsive to sternal rub. Appears comfortable with non-labored respirations. Recently given prn morphine by RN.   Wife, daughter, and son at bedside. Wife confirms plan to keep him "comfortable." They speak of a great decline in his health in the last week and hospice was going to be initiated. Discussed goal of comfort and dignity at EOL. Educated on medications as needed to ensure comfort and relief from suffering. Educated on EOL expectations. Waiting on hospice facility bed. I did explain that we would not put him through a transfer if felt unstable for transfer. Family understands and appreciative.  Emotional support provided. Offered chaplain support. Family's pastor has been to bedside. Answered all questions and concerns. PMT contact information given.   Length of Stay: 0  Current Medications: Scheduled Meds:    Continuous Infusions: . chlorproMAZINE (THORAZINE) IV      PRN Meds: acetaminophen **OR** acetaminophen, alum & mag hydroxide-simeth, bisacodyl, chlorproMAZINE (THORAZINE) IV, diphenhydrAMINE, glycopyrrolate **OR** glycopyrrolate **OR** glycopyrrolate, ipratropium-albuterol, magic mouthwash **AND** lidocaine, LORazepam **OR** LORazepam **OR** LORazepam, morphine injection, ondansetron **OR** ondansetron (ZOFRAN) IV, polyvinyl alcohol, senna  Physical Exam  Constitutional: He appears ill.  HENT:  Head: Normocephalic and atraumatic.  Cardiovascular: Regular  rhythm.  Pulmonary/Chest: Breath sounds normal. No accessory muscle usage. No tachypnea. No respiratory distress.  Abdominal: He exhibits distension and ascites. Bowel sounds are decreased. There is no tenderness.  Neurological: He is unresponsive.  Skin:  BLE mottling. Jaundice  Nursing note and vitals reviewed.          Vital Signs: BP (!) 101/52 (BP Location: Right Arm)   Pulse (!) 54   Temp 98.2 F (36.8 C) (Oral)   Resp 16   Wt 88 kg   SpO2 96%   BMI 29.94 kg/m  SpO2: SpO2: 96 % O2 Device: O2 Device: Nasal Cannula O2 Flow Rate: O2 Flow Rate (L/min): 2 L/min(comfort)  Intake/output summary:   Intake/Output Summary (Last 24 hours) at 04/29/2018 1534 Last data filed at 04/29/2018 0600 Gross per 24 hour  Intake 585.1  ml  Output -  Net 585.1 ml   LBM: Last BM Date: 04/29/18 Baseline Weight: Weight: 88 kg Most recent weight: Weight: 88 kg       Palliative Assessment/Data: PPS 10%   Flowsheet Rows     Most Recent Value  Intake Tab  Referral Department  Hospitalist  Unit at Time of Referral  Med/Surg Unit  Palliative Care Primary Diagnosis  Cancer  Palliative Care Type  New Palliative care  Reason for referral  End of [place]  Date first seen by Palliative Care  05/12/2018  Clinical Assessment  Palliative Performance Scale Score  10%  Psychosocial & Spiritual Assessment  Palliative Care Outcomes  Patient/Family meeting held?  Yes  Who was at the meeting?  wife, daughter, son  Palliative Care Outcomes  Clarified goals of care, Counseled regarding hospice, Provided psychosocial or spiritual support, Improved non-pain symptom therapy, Improved pain interventions, Provided end of life care assistance      Patient Active Problem List   Diagnosis Date Noted  . End stage liver disease ([place]) 04/29/2018  . Elevated C-reactive protein (CRP) 09/24/2017  . Elevated sed rate 09/24/2017  . Vitamin D insufficiency 09/24/2017  .  Chronic bilateral low back pain without sciatica 09/18/2017  . Long term current use of opiate analgesic 09/18/2017  . Pharmacologic therapy 09/18/2017  . Disorder of skeletal system 09/18/2017  . Problems influencing health status 09/18/2017  . Chronic pain syndrome 09/18/2017  . Clinical trial participant 07/16/2017  . Arthritis 07/09/2017  . [place] (hepatocellular carcinoma) ([place]) 07/09/2017  . Chronic liver disease and cirrhosis ([place]) 06/19/2017  . Chronic hepatitis C without hepatic coma ([place]) 02/11/2017  . Anasarca 11/27/2016  . Symptomatic anemia 11/27/2016    Palliative Care Assessment & Plan   Patient Profile: Shane Bradley is a 67yo male with known history of hepatis C, liver cirrhosis, stage IV hepatocellular carcinoma, PE on chronic anticoagulation with eliquis, hypertension admitted 11/12 with altered mental status and lethargy. Per family, patient has been declining rapidly at home and was recommended hospice care per primary and oncology. Patient has been unable to take medications due to lethargy. In ED, NA 117, K+ 6.3, WBC 29, Lactic acid 6.8, creat 3.05. Chest xray reveals atelectasis versus pneumonia. Ascites drained from pleurx noted to be foul-smelling and cloudy, suspicious for SBP. Palliative medicine consultation for terminal care/hospice evaluation  Assessment: Acute encephalopathy Stage IV hepatocellular carcinoma Liver cirrhosis Hepatis C Lactic acidosis Ascites Hyponatremia Hyperkalemia Acute renal failure Adult failure to thrive  Recommendations/Plan:  Comfort measures only. Patient actively dying. Anticipate he may die inpatient. Hospice bed not available today.   Continue prn medications to ensure comfort.   Morphine 1-2mg  IV q1h prn pain/dyspnea/tachypnea  Robinul 0.2mg  IV q4h prn secretions  Ativan 1mg  IV q4h prn anxiety/agitation  Thorazine 25mg  IVPB q6h prn hiccoughs.  PMT will continue to follow symptoms inpatient.  Goals of Care and  Additional Recommendations:  Limitations on Scope of Treatment: Full Comfort Care  Code Status: DNR/DNI   Code Status Orders  (From admission, onward)         Start     Ordered   04/29/18 0555  Do not attempt resuscitation (DNR)  Continuous    Question Answer Comment  In the event of cardiac or respiratory ARREST Do not call a "code blue"   In the event of cardiac or respiratory ARREST Do not perform Intubation, CPR, defibrillation or ACLS   In the event of cardiac  or respiratory ARREST Use medication by any route, position, wound care, and other measures to relive pain and suffering. May use oxygen, suction and manual treatment of airway obstruction as needed for comfort.      04/29/18 0556        Code Status History    Date Active Date Inactive Code Status Order ID Comments User Context   04/29/2018 0553 04/29/2018 0556 DNR [id number]  Arta Silence, MD Inpatient   04/18/2018 2336 04/29/2018 0553 DNR [id number]  Hinda Kehr, MD ED   11/27/2016 1902 11/30/2016 1746 Full Code [id number]  Vaughan Basta, MD Inpatient       Prognosis:   Hours - Days  Discharge Planning:  To Be Determined: hospice facility vs. Hospital death  Care plan was discussed with family at bedside, Dr. Brett Albino, [place], hospice liaison  Thank you for allowing the Palliative Medicine Team to assist in the care of this patient.   Time In: 4360 Time Out: 1515 Total Time 64min Prolonged Time Billed  no      Greater than 50%  of this time was spent counseling and coordinating care related to the above assessment and plan.  Ihor Dow, FNP-C Palliative Medicine Team  Phone: [phone] Fax: [phone]  Please contact Palliative Medicine Team phone at [phone] for questions and concerns.

## 2018-04-29 NOTE — Progress Notes (Signed)
New hospice home referral received from [facility]. Hospital care team and family aware there is currently no bed availability.  Patient is a 67 year old man with a history of metastatic Liver Cancer and end stage Liver disease. His son had contacted hospice and plan was for admission to services, but due to changes in mental status and abdominal pain family brought him to the [facility] ED last night. Writer met in the room with patient's wife Juliann Pulse, son Richardson Landry, daughter Ander Purpura, granddaughter and patient's sister. to discuss hospice home servives. Patient appears to be actively dying, deep purple mottling noted to knees, periods of apnea noted. Family had questions regarding s/s of end of life, Probation officer provided emotional support and education. "Gone from My Sight" books provided at family request. Patient did have some audible secretions releived when head of bed raised during visit, discussed positioning and medications. Patient also had hiccups during visit, family reported "he has had them all day". Writer contacted Palliative NP Ihor Dow who will place orders for IV thorazine, family updated and appreciative. Patient does not appear stable for transport at this time. Will reevaluate again tomorrow if bed becomes available, family stated their understanding. Staff RN Rodman Key updated to new orders. Thank you for the opportunity to be involved I the care of this patient and his family. Flo Shanks RN, BSN, [place] and [facility] [place], hospital Liaison [phone]

## 2018-04-29 NOTE — Progress Notes (Addendum)
Patient seen and examined this morning.  Laying in bed, unresponsive.  Family at bedside.  On exam, he has a regular rate and rhythm.  Lungs clear bilaterally.  Normal work of breathing. + Abdominal distention and fluid wave present.  -Agree with admission H&P -Continue full comfort care -Continue comfort medications -Plan for discharge to hospice home when bed is available  Hyman Bible, MD

## 2018-04-30 MED ORDER — MORPHINE 100MG IN NS 100ML (1MG/ML) PREMIX INFUSION
1.0000 mg/h | INTRAVENOUS | Status: DC
  Administered 2018-04-30: 04:00:00 4 mg/h via INTRAVENOUS
  Filled 2018-04-30: qty 100

## 2018-05-02 LAB — BODY FLUID CULTURE

## 2018-05-03 LAB — CULTURE, BLOOD (ROUTINE X 2)
CULTURE: NO GROWTH
Culture: NO GROWTH
SPECIAL REQUESTS: ADEQUATE
SPECIAL REQUESTS: ADEQUATE

## 2018-05-18 NOTE — Progress Notes (Signed)
Patient modeled on upper and LE and rattling heavilly unrelieved with Morphine IV, Ativan IV and robinul IV. Family very anxious and  demanded to talk to hospice nurse. The supervisor Sandria Senter ) notified and she notified the MD on call to move the patient to oncology floor and start morphine drip. Family updated on the care plan. Patient moved to 1C room 122.  Report was given to [place]

## 2018-05-18 NOTE — Discharge Summary (Signed)
[facility] NAME: Carlon Chaloux    MR#:  [id number]  DATE OF BIRTH:  07/09/1950  DATE OF ADMISSION:  05/01/2018   ADMITTING PHYSICIAN: Amelia Jo, MD  DATE OF DEATH: 05-15-2018  PRIMARY CARE PHYSICIAN: Leslie Andrea, MD   ADMISSION DIAGNOSIS:  Hyperkalemia [E87.5] Hyponatremia [E87.1] Hyperammonemia ([place]) [E72.20] Bacterial peritonitis ([place]) [K65.9]  SECONDARY DIAGNOSIS:   Past Medical History:  Diagnosis Date  . Alcoholic cirrhosis of liver with ascites ([place])   . Arthritis    right foot  . Back complaints   . Cancer [facility])    Stage 4 Liver Cancer  . Hepatitis    "C" - New DX (12/11/16)  . Hypertension    HOSPITAL COURSE:   Pranshu is a 67 year old male with a past medical history of stage IV hepatic carcinoma, alcoholic cirrhosis of the liver with ascites, and hypertension who presented to the ED with altered mental status and lethargy.  Per family, patient had been declining rapidly over the last week.  Labs in the ED were significant for sodium 117, potassium 6.3, WBC 29, lactic acid 6.8, creatinine 3.05.  Ascites fluid drained by Pleurx was noted to be cloudy and foul-smelling.  Per discussion between the admitting physician and the family, decision was made for hospice and full comfort care.  Palliative care was consulted and assisted with symptom management.  Patient passed away in the morning of May 15, 2018.  CONSULTS OBTAINED:  Palliative care DISCHARGE CONDITION:  Expired VITAL SIGNS:  Blood pressure 118/74, pulse (!) 119, temperature 99.3 F (37.4 C), temperature source Oral, resp. rate (!) 30, weight 88 kg, SpO2 91 %. PHYSICAL EXAMINATION:  GENERAL:  67 y.o.-year-old patient lying in the bed. EYES: Eyes closed HEENT: Head atraumatic, normocephalic. Oropharynx and nasopharynx clear.  NECK:  Supple, no jugular venous distention. No thyroid enlargement, no tenderness.  LUNGS: No respirations  noted CARDIOVASCULAR: No cardiac activity noted ABDOMEN: + Distention EXTREMITIES: No pedal edema NEUROLOGIC: No neurological function SKIN: No obvious rash, lesion, or ulcer.  DATA REVIEW:   CBC Recent Labs  Lab 04/23/2018 2103  WBC 29.2*  HGB 15.0  HCT 44.7  PLT 277    Chemistries  Recent Labs  Lab 04/23/2018 2103  NA 117*  K 6.3*  CL 85*  CO2 17*  GLUCOSE 162*  BUN 83*  CREATININE 3.05*  CALCIUM 9.4  AST 254*  ALT 318*  ALKPHOS 720*  BILITOT 2.8*     Microbiology Results  Results for orders placed or performed during the hospital encounter of 04/22/2018  Blood culture (routine x 2)     Status: None (Preliminary result)   Collection Time: 04/18/2018  9:44 PM  Result Value Ref Range Status   Specimen Description BLOOD RIGHT ANTECUBITAL  Final   Special Requests   Final    BOTTLES DRAWN AEROBIC AND ANAEROBIC Blood Culture adequate volume   Culture   Final    NO GROWTH 2 DAYS Performed at [facility], [address], [place], [place] [address]    Report Status PENDING  Incomplete  Blood culture (routine x 2)     Status: None (Preliminary result)   Collection Time: 05/03/2018  9:44 PM  Result Value Ref Range Status   Specimen Description BLOOD BLOOD RIGHT HAND  Final   Special Requests   Final    BOTTLES DRAWN AEROBIC AND ANAEROBIC Blood Culture adequate volume   Culture   Final    NO GROWTH  2 DAYS Performed at [facility], [address] [place], [place] [address]    Report Status PENDING  Incomplete  Body fluid culture     Status: None (Preliminary result)   Collection Time: 04/21/2018  9:44 PM  Result Value Ref Range Status   Specimen Description   Final    ASCITIC Performed at [facility], [place] [address], [place], [place] [address]    Special Requests   Final    NONE Performed at [facility], [address] [place], [place] [address]    Gram Stain   Final    RARE WBC PRESENT, PREDOMINANTLY MONONUCLEAR NO  ORGANISMS SEEN    Culture   Final    CULTURE REINCUBATED FOR BETTER GROWTH Performed at [facility], [place] [address] [place], [place] [address]    Report Status PENDING  Incomplete    RADIOLOGY:  No results found.   Management plans discussed with the patient, family and they are in agreement.  CODE STATUS: DNR   TOTAL TIME TAKING CARE OF THIS PATIENT: 33 minutes.    Berna Spare Amandajo Gonder M.D on 05-14-18 at 11:11 AM  Between 7am to 6pm - Pager - [phone]  After 6pm go to [email or web address] - Proofreader  [employer] [facility]  Office  [phone]  CC: Primary care physician; Leslie Andrea, MD   Note: This dictation was prepared with Dragon dictation along with smaller phrase technology. Any transcriptional errors that result from this process are unintentional.

## 2018-05-18 NOTE — Progress Notes (Signed)
Spoke with hospitalist about increasing morphine and no change in respiration.  Family concerned about pt being comfortable.  Per Dr Aliene Altes ok to give 2 mg Morphine bolus and increase drip to 7 mg/hr.  Explained to family that pt respirations may not be decreased as much as we would like because of metabolic/hepatic imbalance and the body is trying to compensate with rapid breathing. Family given lavender essential oil by Caren Griffins, charge nurse , aroma therapy specialist. Dorna Bloom RN

## 2018-05-18 NOTE — Progress Notes (Signed)
Chaplain offered Shane Bradley and his family emotional and spiritual support (prayer and blessing). End of life education offered. Shane Bradley is Protestant. Chaplain continues to be available upon request.     May 02, 2018 0400  Clinical Encounter Type  Visited With Patient and family together  Visit Type Patient actively dying  Referral From Nurse  Spiritual Encounters  Spiritual Needs Grief support

## 2018-05-18 DEATH — deceased

## 2020-10-24 IMAGING — CT CT HEAD W/O CM
3 series · 16 of 47 positions shown, 19 images · non-contrast
Comparison: None.

CLINICAL DATA: Encephalopathy altered mental status history of
liver cancer

EXAM:
CT HEAD WITHOUT CONTRAST
TECHNIQUE: Contiguous axial images were obtained from the base of the skull
through the vertex without intravenous contrast.

[Series 3: head wo · axial · 0.44mm/px · z∈[-109,+16]mm · 10 of 30 slices shown, 13 images]
[im 3/30  brain]
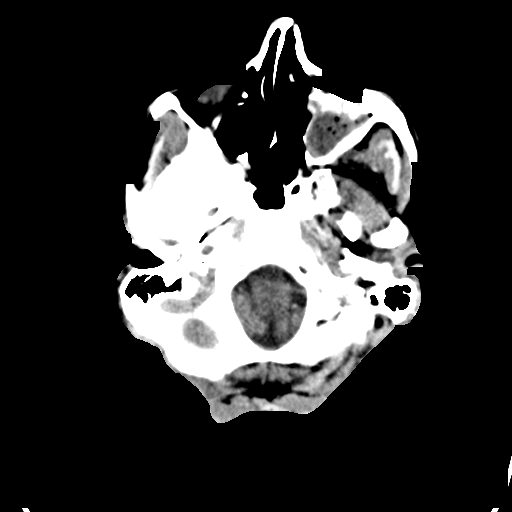
[im 3/30  bone]
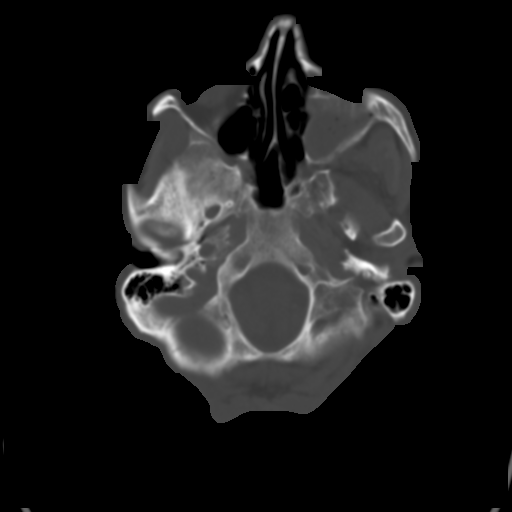
[im 6/30  brain]
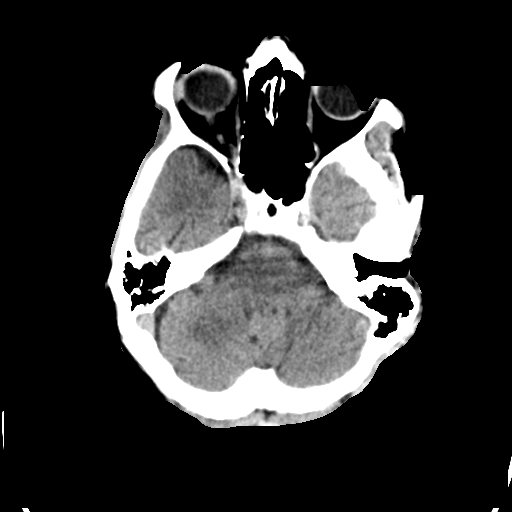
[im 9/30  brain]
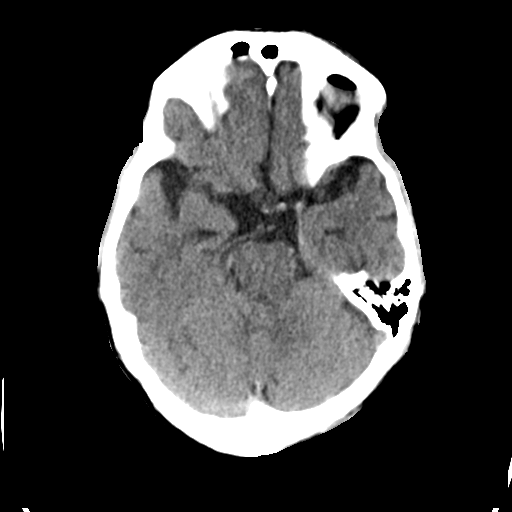
[im 11/30  brain]
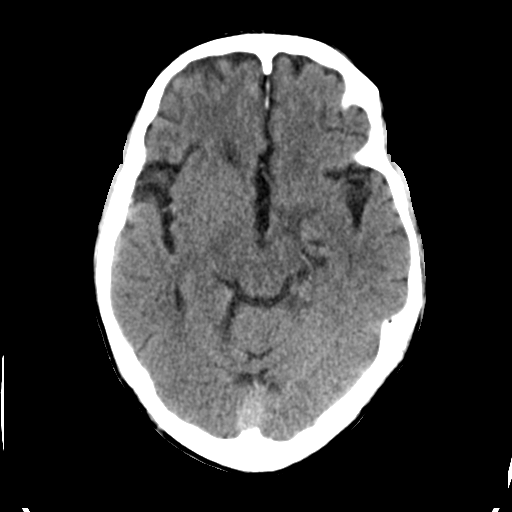
[im 14/30  brain]
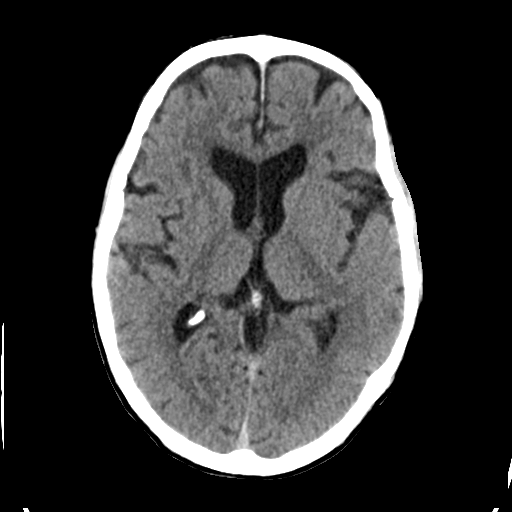
[im 14/30  bone]
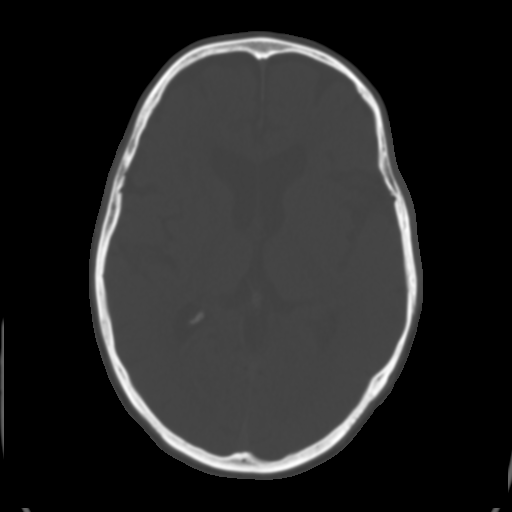
[im 17/30  brain]
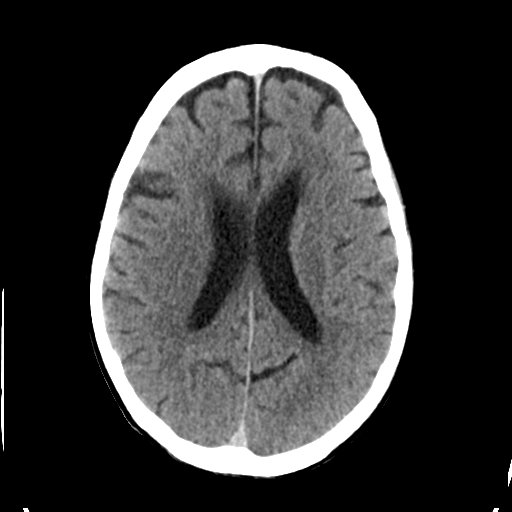
[im 20/30  brain]
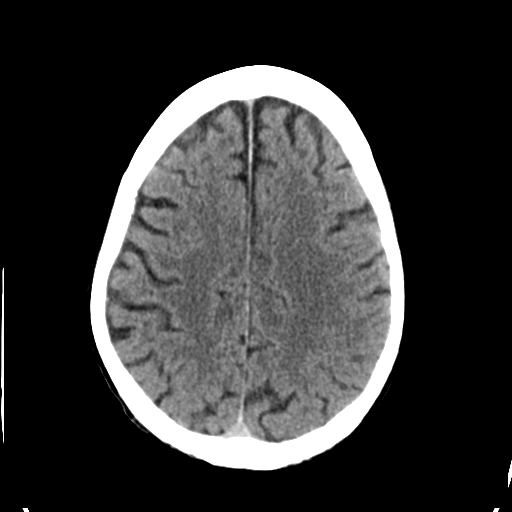
[im 23/30  brain]
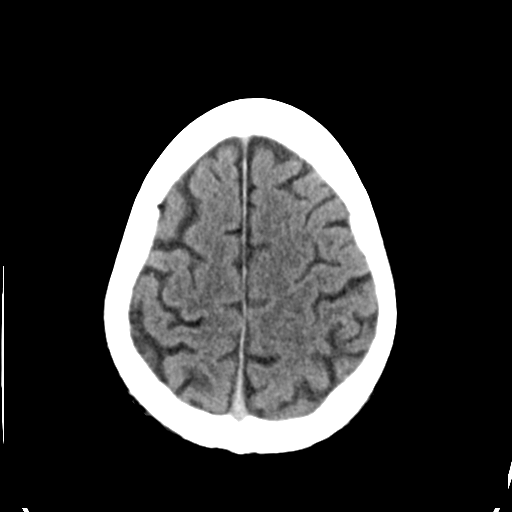
[im 25/30  brain]
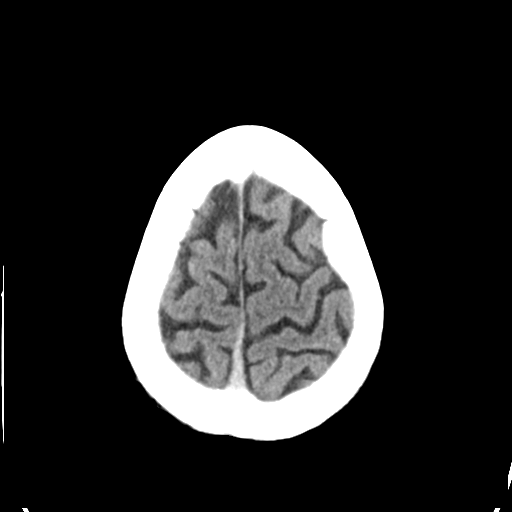
[im 25/30  bone]
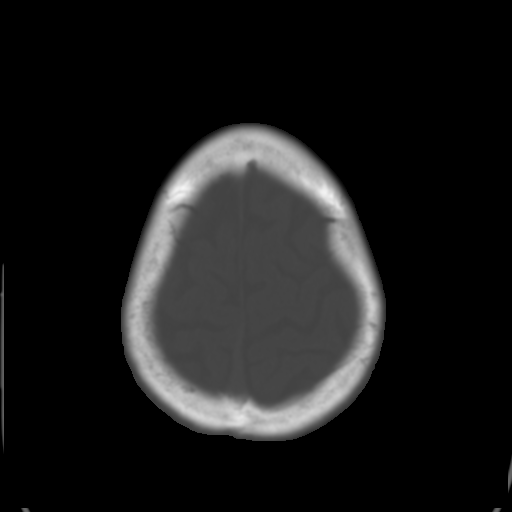
[im 28/30  brain]
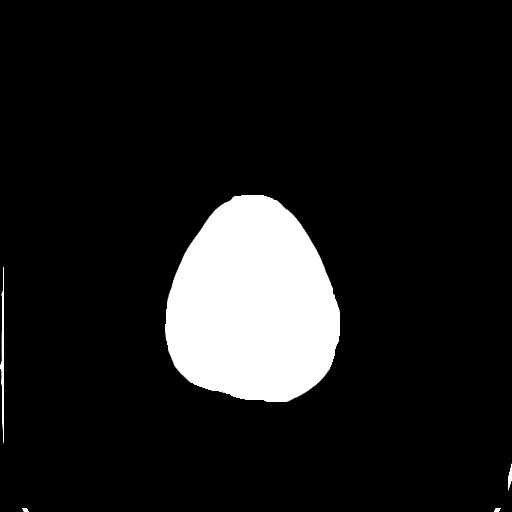

[Series 4: coronal soft tissue · coronal · 0.29mm/px · 3 of 67 slices shown]
[im 23/67  brain]
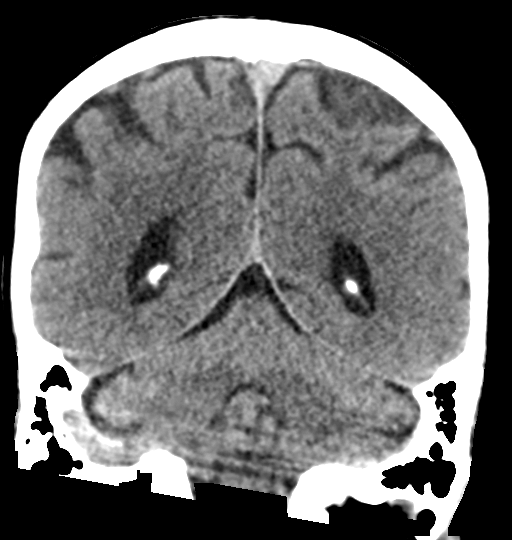
[im 30/67  brain]
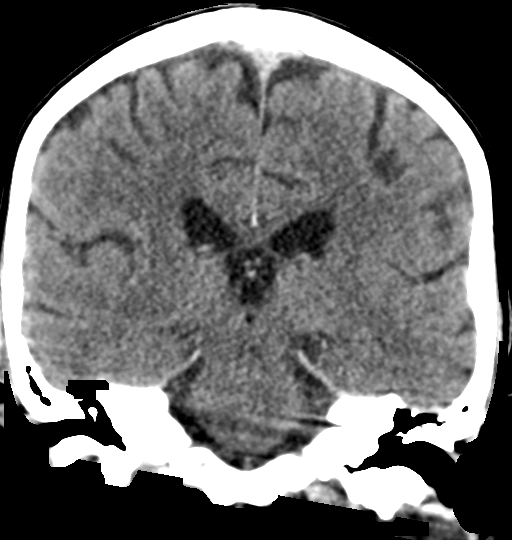
[im 37/67  brain]
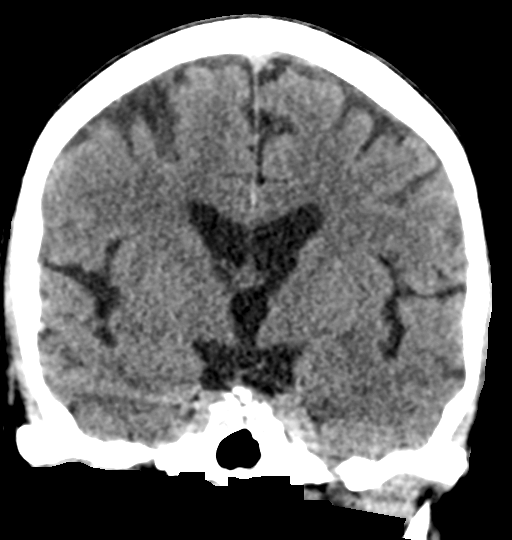

[Series 5: sagittal soft tissue · sagittal · 0.30mm/px · 3 of 50 slices shown]
[im 19/50  brain]
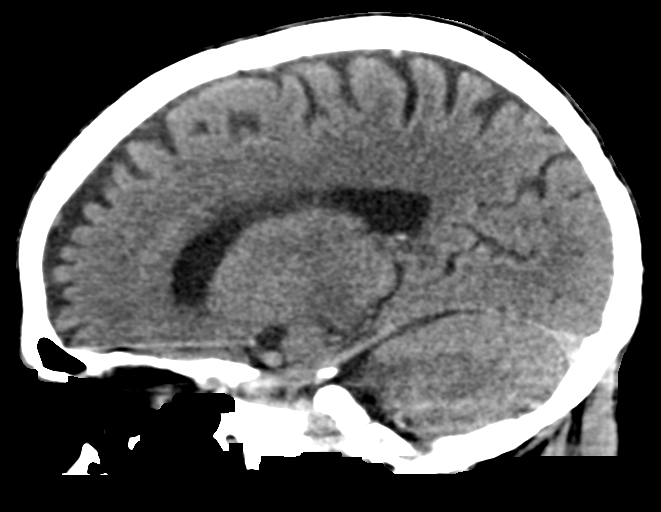
[im 25/50  brain]
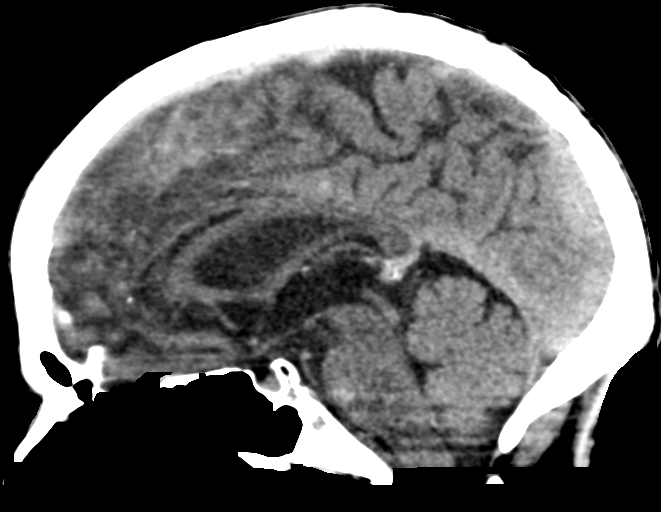
[im 32/50  brain]
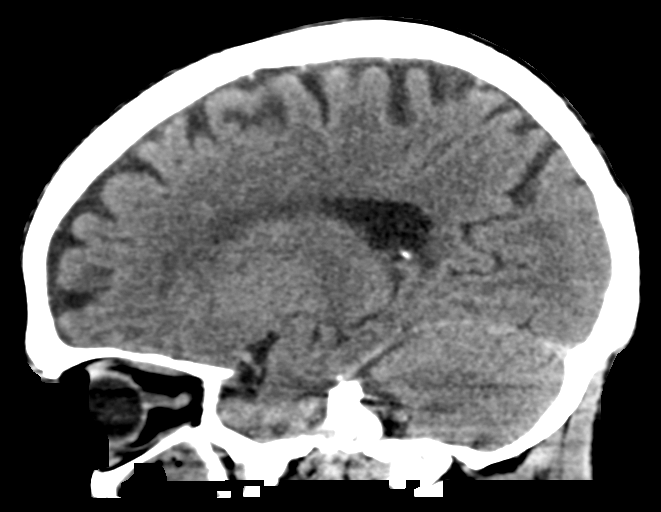

[16 of 47 positions shown; findings below may reference images not displayed]

FINDINGS: Brain: No acute territorial infarction, hemorrhage or intracranial
mass. Moderate atrophy. Minimal small vessel ischemic change of the
white matter. Ventricles felt secondary to atrophy.

Vascular: No hyperdense vessels.  No unexpected calcification

Skull: Normal. Negative for fracture or focal lesion.

Sinuses/Orbits: Fluid level and mucosal disease in left maxillary
sinus

Other: None
IMPRESSION: 1. CT evidence for acute intracranial.
2. Atrophy and minimal small vessel ischemic changes of the white
matter
3. Left maxillary sinusitis
